# Patient Record
Sex: Female | Born: 1945
Health system: Southern US, Community
[De-identification: ages and names within clinical notes are randomized; demographics above are authoritative.]

## PROBLEM LIST (undated history)

## (undated) DIAGNOSIS — C541 Malignant neoplasm of endometrium: Secondary | ICD-10-CM

## (undated) DIAGNOSIS — I10 Essential (primary) hypertension: Secondary | ICD-10-CM

## (undated) DIAGNOSIS — L570 Actinic keratosis: Secondary | ICD-10-CM

## (undated) HISTORY — DX: Essential (primary) hypertension: I10

## (undated) HISTORY — DX: Actinic keratosis: L57.0

## (undated) HISTORY — DX: Malignant neoplasm of endometrium: C54.1

---

## 2003-09-06 HISTORY — PX: CATARACT EXTRACTION: SUR2

## 2011-08-06 HISTORY — PX: CERVICAL POLYPECTOMY: SHX88

## 2011-08-06 HISTORY — PX: HYSTEROSCOPY: SHX211

## 2011-08-15 HISTORY — PX: ENDOMETRIAL BIOPSY: SHX622

## 2011-09-06 DIAGNOSIS — C541 Malignant neoplasm of endometrium: Secondary | ICD-10-CM

## 2011-09-06 HISTORY — PX: BILATERAL SALPINGOOPHORECTOMY: SHX1223

## 2011-09-06 HISTORY — DX: Malignant neoplasm of endometrium: C54.1

## 2011-09-06 HISTORY — PX: ABDOMINAL HYSTERECTOMY: SHX81

## 2011-10-12 ENCOUNTER — Ambulatory Visit: Payer: Self-pay | Admitting: Obstetrics and Gynecology

## 2011-10-12 LAB — BASIC METABOLIC PANEL
Anion Gap: 10 (ref 7–16)
Co2: 24 mmol/L (ref 21–32)
Creatinine: 0.78 mg/dL (ref 0.60–1.30)
EGFR (African American): >60
Sodium: 140 mmol/L (ref 136–145)

## 2011-10-12 LAB — HEMOGLOBIN: HGB: 13.6 g/dL (ref 12.0–16.0)

## 2011-10-21 ENCOUNTER — Ambulatory Visit: Payer: Self-pay | Admitting: Obstetrics and Gynecology

## 2011-10-26 LAB — PATHOLOGY REPORT

## 2012-03-29 DIAGNOSIS — C549 Malignant neoplasm of corpus uteri, unspecified: Secondary | ICD-10-CM | POA: Insufficient documentation

## 2012-06-04 DIAGNOSIS — H1045 Other chronic allergic conjunctivitis: Secondary | ICD-10-CM | POA: Diagnosis not present

## 2012-08-08 DIAGNOSIS — H16209 Unspecified keratoconjunctivitis, unspecified eye: Secondary | ICD-10-CM | POA: Diagnosis not present

## 2012-08-14 DIAGNOSIS — H15009 Unspecified scleritis, unspecified eye: Secondary | ICD-10-CM | POA: Diagnosis not present

## 2012-08-23 DIAGNOSIS — H15009 Unspecified scleritis, unspecified eye: Secondary | ICD-10-CM | POA: Diagnosis not present

## 2012-09-06 DIAGNOSIS — H15009 Unspecified scleritis, unspecified eye: Secondary | ICD-10-CM | POA: Diagnosis not present

## 2012-09-21 DIAGNOSIS — B351 Tinea unguium: Secondary | ICD-10-CM | POA: Diagnosis not present

## 2012-09-21 DIAGNOSIS — L84 Corns and callosities: Secondary | ICD-10-CM | POA: Diagnosis not present

## 2012-09-27 DIAGNOSIS — H15009 Unspecified scleritis, unspecified eye: Secondary | ICD-10-CM | POA: Diagnosis not present

## 2012-10-04 DIAGNOSIS — C549 Malignant neoplasm of corpus uteri, unspecified: Secondary | ICD-10-CM | POA: Diagnosis not present

## 2013-03-01 DIAGNOSIS — R030 Elevated blood-pressure reading, without diagnosis of hypertension: Secondary | ICD-10-CM | POA: Diagnosis not present

## 2013-03-01 DIAGNOSIS — C549 Malignant neoplasm of corpus uteri, unspecified: Secondary | ICD-10-CM | POA: Diagnosis not present

## 2013-03-01 DIAGNOSIS — Z23 Encounter for immunization: Secondary | ICD-10-CM | POA: Diagnosis not present

## 2013-03-13 ENCOUNTER — Ambulatory Visit: Payer: Self-pay | Admitting: Family Medicine

## 2013-03-13 DIAGNOSIS — N6459 Other signs and symptoms in breast: Secondary | ICD-10-CM | POA: Diagnosis not present

## 2013-03-13 DIAGNOSIS — Z1231 Encounter for screening mammogram for malignant neoplasm of breast: Secondary | ICD-10-CM | POA: Diagnosis not present

## 2013-03-18 ENCOUNTER — Ambulatory Visit: Payer: Self-pay | Admitting: Family Medicine

## 2013-03-18 DIAGNOSIS — R928 Other abnormal and inconclusive findings on diagnostic imaging of breast: Secondary | ICD-10-CM | POA: Diagnosis not present

## 2013-03-18 DIAGNOSIS — N6459 Other signs and symptoms in breast: Secondary | ICD-10-CM | POA: Diagnosis not present

## 2013-04-15 DIAGNOSIS — Z0000 Encounter for general adult medical examination without abnormal findings: Secondary | ICD-10-CM | POA: Diagnosis not present

## 2013-04-15 DIAGNOSIS — I10 Essential (primary) hypertension: Secondary | ICD-10-CM | POA: Diagnosis not present

## 2013-04-15 DIAGNOSIS — Z23 Encounter for immunization: Secondary | ICD-10-CM | POA: Diagnosis not present

## 2013-04-15 DIAGNOSIS — R9430 Abnormal result of cardiovascular function study, unspecified: Secondary | ICD-10-CM | POA: Diagnosis not present

## 2013-04-16 DIAGNOSIS — Z1159 Encounter for screening for other viral diseases: Secondary | ICD-10-CM | POA: Diagnosis not present

## 2013-04-16 DIAGNOSIS — R030 Elevated blood-pressure reading, without diagnosis of hypertension: Secondary | ICD-10-CM | POA: Diagnosis not present

## 2013-04-16 DIAGNOSIS — Z13220 Encounter for screening for lipoid disorders: Secondary | ICD-10-CM | POA: Diagnosis not present

## 2013-04-22 DIAGNOSIS — R6889 Other general symptoms and signs: Secondary | ICD-10-CM | POA: Diagnosis not present

## 2013-05-03 ENCOUNTER — Ambulatory Visit: Payer: Medicare Other | Admitting: Adult Health

## 2013-05-03 ENCOUNTER — Ambulatory Visit (INDEPENDENT_AMBULATORY_CARE_PROVIDER_SITE_OTHER): Payer: Medicare Other | Admitting: Cardiovascular Disease

## 2013-05-03 ENCOUNTER — Encounter: Payer: Self-pay | Admitting: Cardiovascular Disease

## 2013-05-03 ENCOUNTER — Encounter: Payer: Self-pay | Admitting: Adult Health

## 2013-05-03 VITALS — BP 180/108 | HR 87 | Ht 63.0 in | Wt 213.0 lb

## 2013-05-03 DIAGNOSIS — R9431 Abnormal electrocardiogram [ECG] [EKG]: Secondary | ICD-10-CM | POA: Diagnosis not present

## 2013-05-03 DIAGNOSIS — I10 Essential (primary) hypertension: Secondary | ICD-10-CM

## 2013-05-03 NOTE — Assessment & Plan Note (Signed)
EKG is suggestive of  left ventricular hypertrophy. Symptoms include mild dyspnea but no chest discomfort. I recommend an echocardiogram for evaluation. She can followup as needed.

## 2013-05-03 NOTE — Progress Notes (Signed)
HPI  This is a 67 year old female who was referred by Dr. Elease Hashimoto for evaluation of hypertension and an abnormal EKG. The patient does not have any previous cardiac history. She has known history of hypertension for a few years but has not been on any medication until recently. There is no family history of premature coronary artery disease. She is a lifelong nonsmoker. She denies any chest pain or significant dyspnea. She walks 2-4 miles per day for exercise. She suffers from significant stress and anxiety. Her blood pressure is very active doing her anxiety status. She reports having high blood pressure readings at the physician's office but more controlled readings at home. She was recently started on hydrochlorothiazide 12.5 mg once daily. Recent labs were reviewed and showed a total cholesterol of 181. CBC was unremarkable. Basic metabolic profile was within normal limits. Thyroid function was normal. EKG showed sinus rhythm with left ventricular hypertrophy.  Allergies  Allergen Reactions  . Voltaren [Diclofenac Sodium] Swelling    Eye drops     Current Outpatient Prescriptions on File Prior to Visit  Medication Sig Dispense Refill  . Biotin 1 MG CAPS Take by mouth.      . hydrochlorothiazide (MICROZIDE) 12.5 MG capsule Take 12.5 mg by mouth daily.      . Omega-3 Fatty Acids (FISH OIL) 1200 MG CAPS Take 1 capsule by mouth daily.       No current facility-administered medications on file prior to visit.     Past Medical History  Diagnosis Date  . Hypertension   . Endometrial cancer 2013     Past Surgical History  Procedure Laterality Date  . Abdominal hysterectomy  2013    BSO  . Bilateral salpingoophorectomy  2013  . Cataract extraction  2005     Family History  Problem Relation Age of Onset  . Arthritis Father   . Diabetes Father     "borderline"  . Hypertension Father   . Hypertension Sister   . Diabetes Sister   . Cancer Brother     Skin CA  . Cancer  Daughter     skin CA     History   Social History  . Marital Status: Married    Spouse Name: N/A    Number of Children: N/A  . Years of Education: N/A   Occupational History  . Not on file.   Social History Main Topics  . Smoking status: Never Smoker   . Smokeless tobacco: Never Used  . Alcohol Use: Yes     Comment: occasional glass of wine  . Drug Use: No  . Sexual Activity: Yes    Birth Control/ Protection: Surgical   Other Topics Concern  . Not on file   Social History Narrative  . No narrative on file     ROS A 10 point  review of system was performed to is negative other than what is mentioned in the history of present illness.  PHYSICAL EXAM   BP 180/108  Pulse 87  Ht 5\' 3"  (1.6 m)  Wt 213 lb (96.616 kg)  BMI 37.74 kg/m2 Constitutional: She is oriented to person, place, and time. She appears well-developed and well-nourished. No distress.  HENT: No nasal discharge.  Head: Normocephalic and atraumatic.  Eyes: Pupils are equal and round. Right eye exhibits no discharge. Left eye exhibits no discharge.  Neck: Normal range of motion. Neck supple. No JVD present. No thyromegaly present.  Cardiovascular: Normal rate, regular rhythm, normal heart sounds.  Exam reveals no gallop and no friction rub. No murmur heard.  Pulmonary/Chest: Effort normal and breath sounds normal. No stridor. No respiratory distress. She has no wheezes. She has no rales. She exhibits no tenderness.  Abdominal: Soft. Bowel sounds are normal. She exhibits no distension. There is no tenderness. There is no rebound and no guarding.  Musculoskeletal: Normal range of motion. She exhibits no edema and no tenderness.  Neurological: She is alert and oriented to person, place, and time. Coordination normal.  Skin: Skin is warm and dry. No rash noted. She is not diaphoretic. No erythema. No pallor.  Psychiatric: She has a normal mood and affect. Her behavior is normal. Judgment and thought content  normal.    EKG: Sinus  Rhythm  Borderline left ventricular hypertrophy   ASSESSMENT AND PLAN

## 2013-05-03 NOTE — Progress Notes (Signed)
This encounter was created in error - please disregard.

## 2013-05-03 NOTE — Assessment & Plan Note (Signed)
It appears that the patient suffers from white coat syndrome with significant reactive hypertension. Underlying anxiety seems to be significantly contributing to this. Repeat blood pressure by me was 168/98. Most of her home blood pressure readings less than 140 systolic. Continue treatment with hydrochlorothiazide. Consider adding a small dose beta blocker if her blood pressure continues to run high. 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

## 2013-05-03 NOTE — Patient Instructions (Addendum)
Your physician has requested that you have an echocardiogram. Echocardiography is a painless test that uses sound waves to create images of your heart. It provides your doctor with information about the size and shape of your heart and how well your heart's chambers and valves are working. This procedure takes approximately one hour. There are no restrictions for this procedure.  Follow up as needed 

## 2013-05-16 DIAGNOSIS — R7309 Other abnormal glucose: Secondary | ICD-10-CM | POA: Diagnosis not present

## 2013-05-16 DIAGNOSIS — Z0000 Encounter for general adult medical examination without abnormal findings: Secondary | ICD-10-CM | POA: Diagnosis not present

## 2013-05-16 DIAGNOSIS — I10 Essential (primary) hypertension: Secondary | ICD-10-CM | POA: Diagnosis not present

## 2013-05-16 DIAGNOSIS — Z23 Encounter for immunization: Secondary | ICD-10-CM | POA: Diagnosis not present

## 2013-05-22 ENCOUNTER — Other Ambulatory Visit: Payer: Self-pay

## 2013-05-22 ENCOUNTER — Other Ambulatory Visit (INDEPENDENT_AMBULATORY_CARE_PROVIDER_SITE_OTHER): Payer: Medicare Other

## 2013-05-22 DIAGNOSIS — I059 Rheumatic mitral valve disease, unspecified: Secondary | ICD-10-CM

## 2013-05-22 DIAGNOSIS — R9431 Abnormal electrocardiogram [ECG] [EKG]: Secondary | ICD-10-CM | POA: Diagnosis not present

## 2013-05-22 DIAGNOSIS — I10 Essential (primary) hypertension: Secondary | ICD-10-CM

## 2013-05-23 ENCOUNTER — Telehealth: Payer: Self-pay

## 2013-05-23 NOTE — Telephone Encounter (Signed)
Message copied by Marilynne Halsted on Thu May 23, 2013  9:04 AM ------      Message from: Lorine Bears A      Created: Thu May 23, 2013  8:19 AM       Inform patient that echo was normal except mild thickening of the heart muscle which is due to hight blood pressure.       Send a copy to Dr. Elease Hashimoto ------

## 2013-05-23 NOTE — Telephone Encounter (Signed)
Spoke w/ pt.  She is aware of results.  

## 2013-06-26 DIAGNOSIS — Z0000 Encounter for general adult medical examination without abnormal findings: Secondary | ICD-10-CM | POA: Diagnosis not present

## 2013-06-26 DIAGNOSIS — Z23 Encounter for immunization: Secondary | ICD-10-CM | POA: Diagnosis not present

## 2013-06-26 DIAGNOSIS — I10 Essential (primary) hypertension: Secondary | ICD-10-CM | POA: Diagnosis not present

## 2013-06-26 DIAGNOSIS — R7309 Other abnormal glucose: Secondary | ICD-10-CM | POA: Diagnosis not present

## 2013-11-29 ENCOUNTER — Ambulatory Visit: Payer: Self-pay | Admitting: Family Medicine

## 2013-11-29 DIAGNOSIS — R922 Inconclusive mammogram: Secondary | ICD-10-CM | POA: Diagnosis not present

## 2013-11-29 DIAGNOSIS — N6489 Other specified disorders of breast: Secondary | ICD-10-CM | POA: Diagnosis not present

## 2013-12-12 IMAGING — US ULTRASOUND RIGHT BREAST
1 series · 13 of 22 positions shown · non-contrast
Comparison: 03/13/2013.

REASON FOR EXAM: av rt asymmetry
COMMENTS:

PROCEDURE:     US  - US BREAST RIGHT  - March 18, 2013 [DATE]
RESULT:

[Series 1: ultrasound right breast · 0.10mm/px · 13 of 22 slices shown]
[im 1/22]
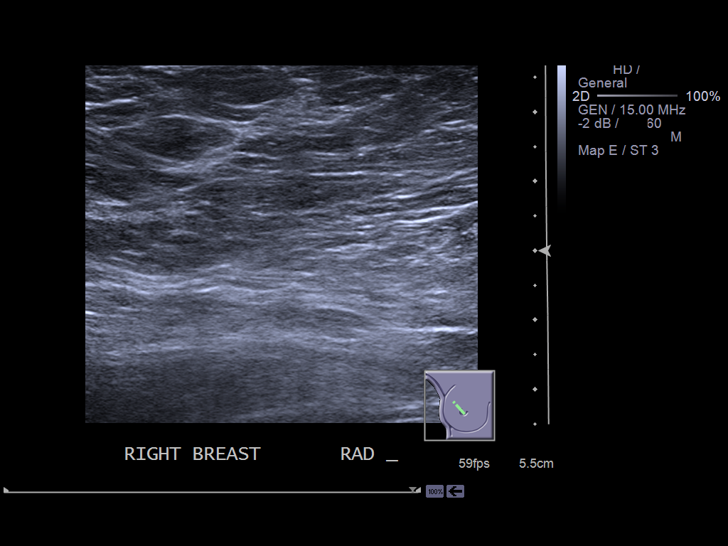
[im 3/22]
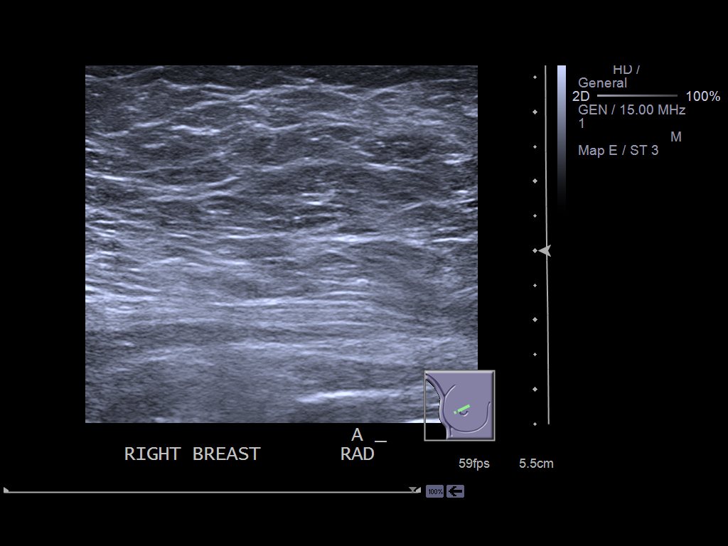
[im 5/22]
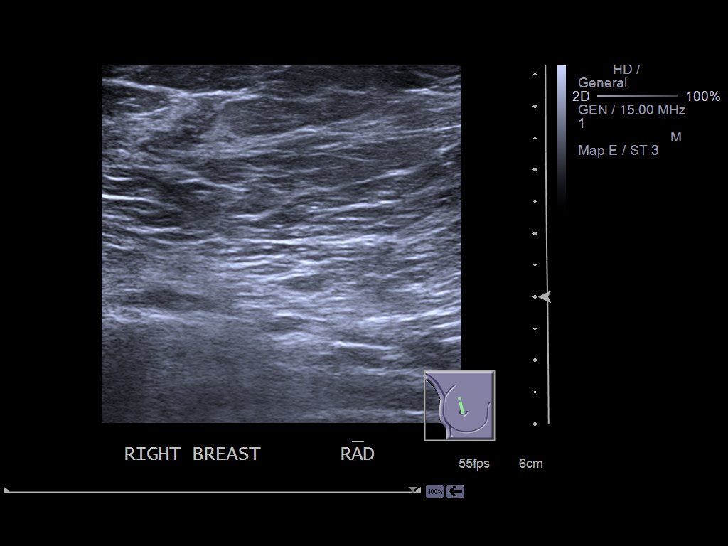
[im 6/22]
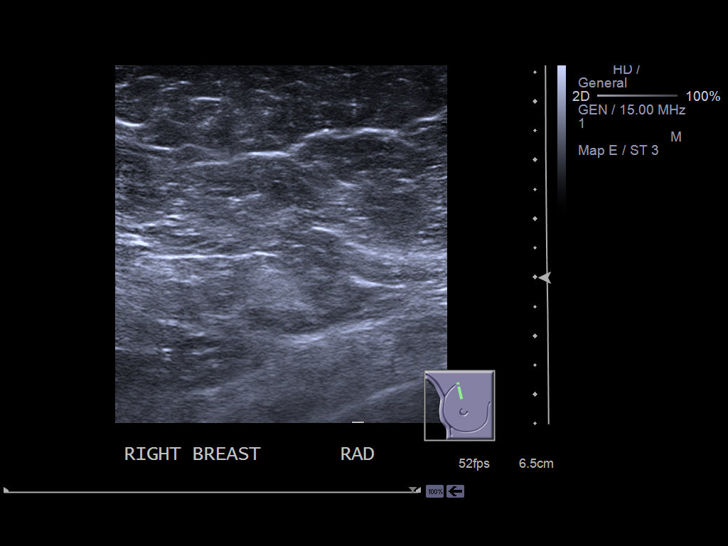
[im 8/22]
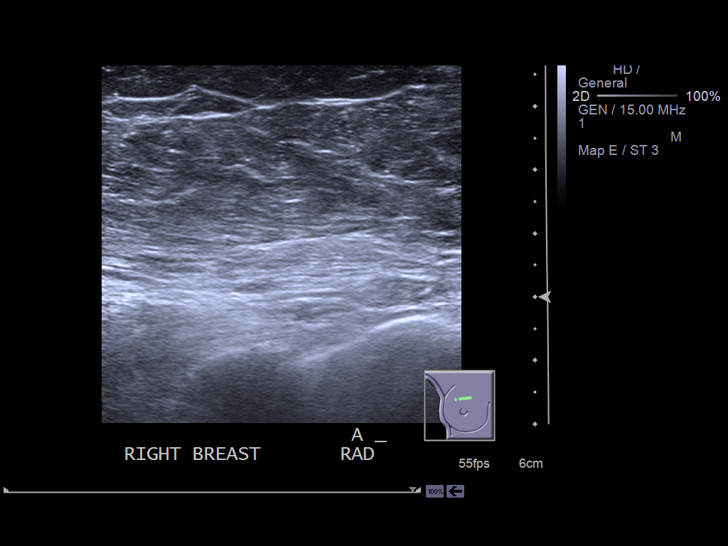
[im 10/22]
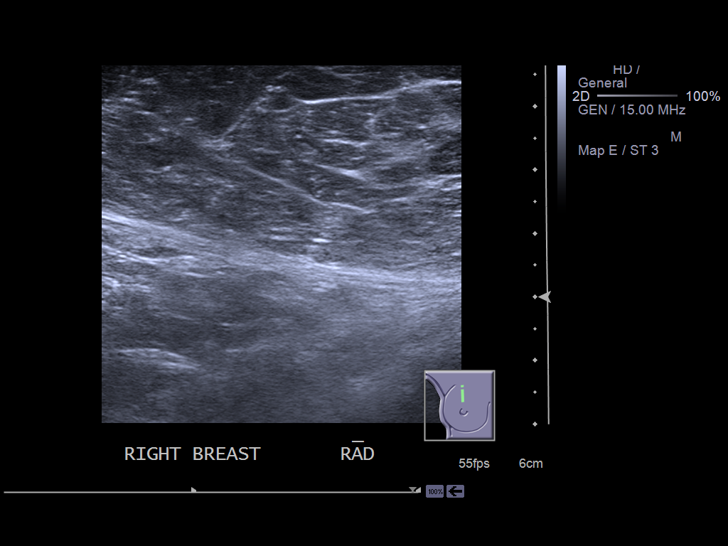
[im 12/22]
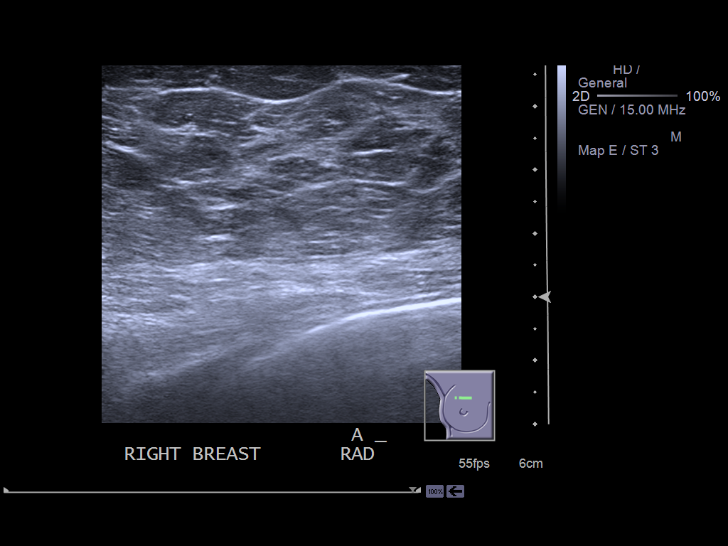
[im 13/22]
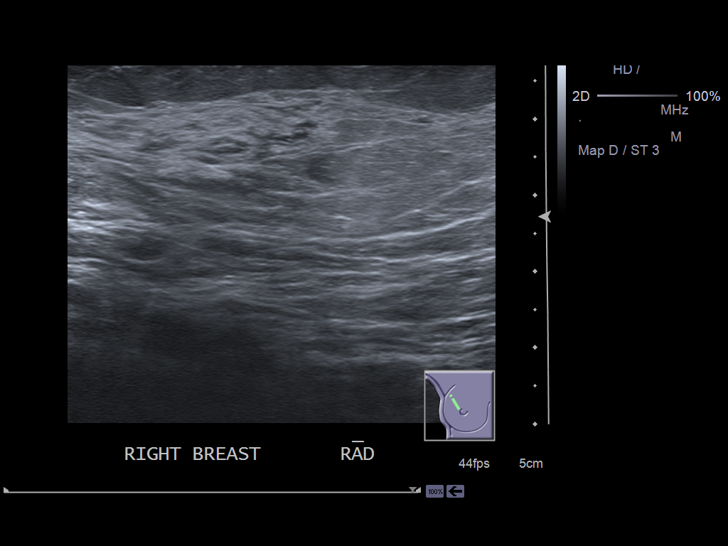
[im 15/22]
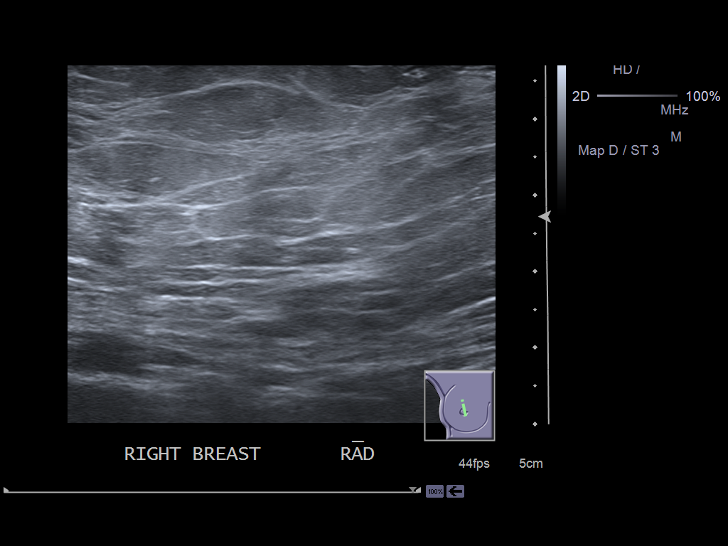
[im 17/22]
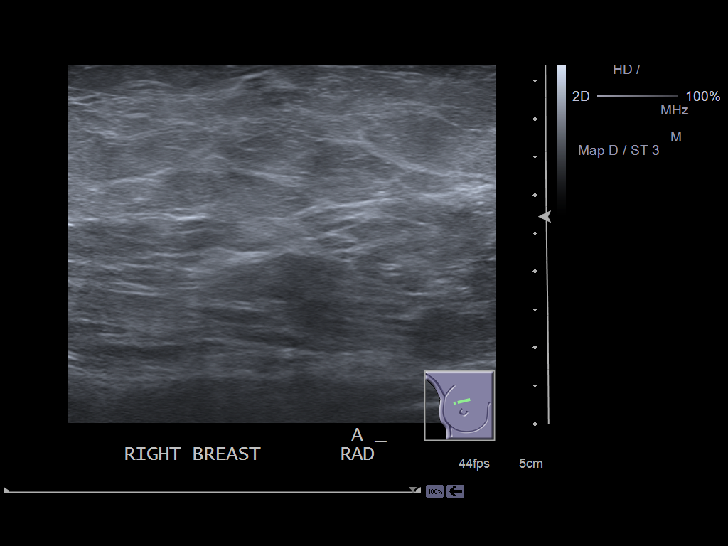
[im 18/22]
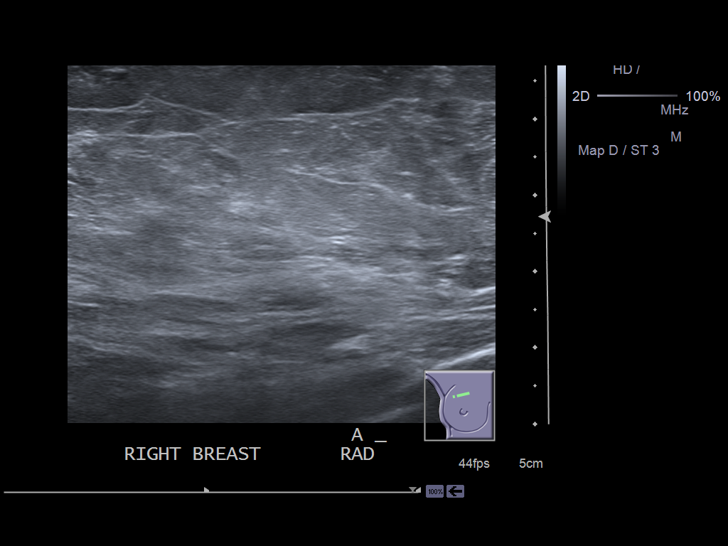
[im 20/22]
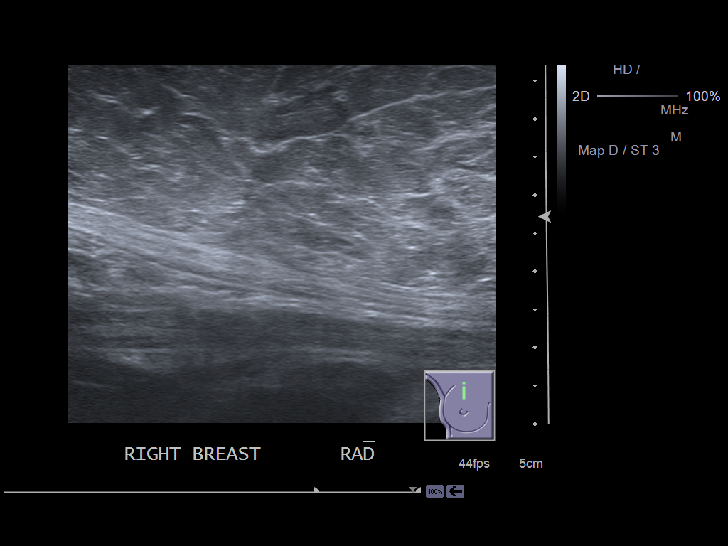
[im 22/22]
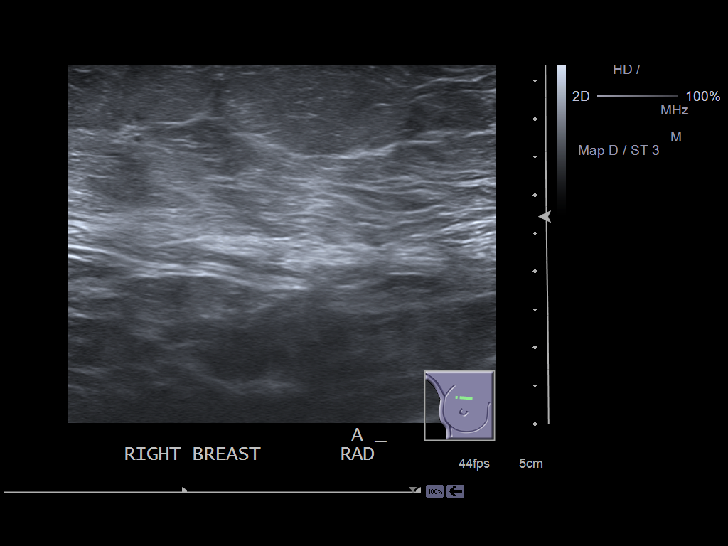

[13 of 22 positions shown; findings below may reference images not displayed]

FINDINGS: True lateral and spot compression magnification views were performed to
evaluate the global asymmetry in the superolateral right breast. With the
spot compression magnification view, this region demonstrates the appearance
of normal fibroglandular tissue. No mass or architectural distortion
identified. No suspicious calcifications are seen.

Real time ultrasound was performed of the superolateral right breast from 10
o'clock through 12 o'clock. No mass or suspicious shadowing was identified.
IMPRESSION: BI-RADS: Category 3 - Probably Benign Finding (interval follow-up)

The global asymmetry in the superolateral right breast is most likely
related to asymmetric breast tissue compared to the left breast, but is
without prior mammograms for comparison of stability. Followup mammograms of
the right breast are recommended in 6 months.  However, if desired by the
patient and/or patient's physician, stereotactic-guided biopsy could be
performed of this region in the interval. If there is palpable abnormality
in the superolateral right breast, biopsy would be recommended at this time.

This was discussed immediately after the ultrasound examination.

## 2013-12-23 DIAGNOSIS — R7309 Other abnormal glucose: Secondary | ICD-10-CM | POA: Diagnosis not present

## 2013-12-23 DIAGNOSIS — C549 Malignant neoplasm of corpus uteri, unspecified: Secondary | ICD-10-CM | POA: Diagnosis not present

## 2013-12-23 DIAGNOSIS — Z23 Encounter for immunization: Secondary | ICD-10-CM | POA: Diagnosis not present

## 2013-12-23 DIAGNOSIS — I10 Essential (primary) hypertension: Secondary | ICD-10-CM | POA: Diagnosis not present

## 2014-06-05 ENCOUNTER — Ambulatory Visit: Payer: Self-pay | Admitting: Family Medicine

## 2014-06-05 DIAGNOSIS — N6489 Other specified disorders of breast: Secondary | ICD-10-CM | POA: Diagnosis not present

## 2014-06-05 DIAGNOSIS — R928 Other abnormal and inconclusive findings on diagnostic imaging of breast: Secondary | ICD-10-CM | POA: Diagnosis not present

## 2014-06-25 DIAGNOSIS — Z23 Encounter for immunization: Secondary | ICD-10-CM | POA: Diagnosis not present

## 2014-06-25 DIAGNOSIS — C541 Malignant neoplasm of endometrium: Secondary | ICD-10-CM | POA: Diagnosis not present

## 2014-06-25 DIAGNOSIS — Z1389 Encounter for screening for other disorder: Secondary | ICD-10-CM | POA: Diagnosis not present

## 2014-06-25 DIAGNOSIS — R7309 Other abnormal glucose: Secondary | ICD-10-CM | POA: Diagnosis not present

## 2014-06-25 DIAGNOSIS — R030 Elevated blood-pressure reading, without diagnosis of hypertension: Secondary | ICD-10-CM | POA: Diagnosis not present

## 2014-06-25 DIAGNOSIS — Z0000 Encounter for general adult medical examination without abnormal findings: Secondary | ICD-10-CM | POA: Diagnosis not present

## 2014-06-25 DIAGNOSIS — I10 Essential (primary) hypertension: Secondary | ICD-10-CM | POA: Diagnosis not present

## 2014-06-25 DIAGNOSIS — R739 Hyperglycemia, unspecified: Secondary | ICD-10-CM | POA: Diagnosis not present

## 2014-06-25 LAB — CBC AND DIFFERENTIAL
HCT: 41 % (ref 36–46)
Hemoglobin: 14 g/dL (ref 12.0–16.0)
Platelets: 303 10*3/uL (ref 150–399)
WBC: 7.9 10*3/mL

## 2014-06-25 LAB — HEPATIC FUNCTION PANEL
ALT: 11 U/L (ref 7–35)
AST: 18 U/L (ref 13–35)

## 2014-06-25 LAB — TSH: TSH: 1.05 u[IU]/mL (ref <=5.90)

## 2014-06-25 LAB — BASIC METABOLIC PANEL
BUN: 16 mg/dL (ref 4–21)
CREATININE: 0.8 mg/dL (ref <=1.1)
Glucose: 101 mg/dL
Potassium: 4.4 mmol/L (ref 3.4–5.3)
Sodium: 139 mmol/L (ref 137–147)

## 2014-12-24 DIAGNOSIS — I10 Essential (primary) hypertension: Secondary | ICD-10-CM | POA: Diagnosis not present

## 2014-12-24 DIAGNOSIS — R739 Hyperglycemia, unspecified: Secondary | ICD-10-CM | POA: Diagnosis not present

## 2014-12-24 LAB — HEMOGLOBIN A1C: Hgb A1c MFr Bld: 5.7 % (ref 4.0–6.0)

## 2014-12-25 DIAGNOSIS — Z8542 Personal history of malignant neoplasm of other parts of uterus: Secondary | ICD-10-CM | POA: Insufficient documentation

## 2014-12-25 DIAGNOSIS — IMO0002 Reserved for concepts with insufficient information to code with codable children: Secondary | ICD-10-CM | POA: Insufficient documentation

## 2014-12-25 DIAGNOSIS — M20009 Unspecified deformity of unspecified finger(s): Secondary | ICD-10-CM | POA: Insufficient documentation

## 2014-12-25 DIAGNOSIS — N950 Postmenopausal bleeding: Secondary | ICD-10-CM | POA: Insufficient documentation

## 2014-12-25 DIAGNOSIS — L821 Other seborrheic keratosis: Secondary | ICD-10-CM | POA: Insufficient documentation

## 2014-12-25 DIAGNOSIS — I8390 Asymptomatic varicose veins of unspecified lower extremity: Secondary | ICD-10-CM | POA: Insufficient documentation

## 2014-12-25 DIAGNOSIS — R7303 Prediabetes: Secondary | ICD-10-CM | POA: Insufficient documentation

## 2014-12-25 DIAGNOSIS — R739 Hyperglycemia, unspecified: Secondary | ICD-10-CM | POA: Insufficient documentation

## 2014-12-25 DIAGNOSIS — C541 Malignant neoplasm of endometrium: Secondary | ICD-10-CM | POA: Insufficient documentation

## 2014-12-25 HISTORY — DX: Morbid (severe) obesity due to excess calories: E66.01

## 2014-12-28 NOTE — Op Note (Signed)
PATIENT NAME:  Kathleen Glenn, Kathleen Glenn MR#:  [id number] DATE OF BIRTH:  01/03/1946  DATE OF PROCEDURE:  10/21/2011  PREOPERATIVE DIAGNOSES:  1. Postmenopausal bleeding.  2. Endometrial fibroid.   POSTOPERATIVE DIAGNOSES:  1. Postmenopausal bleeding.  2. Endometrial polyp.   PROCEDURES:  1. Fractional dilation and curettage. 2. Resectoscopic removal of endometrial polyp.   SURGEON: Laverta Baltimore, MD  FIRST ASSISTANT: None.   ANESTHESIA: General endotracheal.   INDICATION: This is a 69 year old gravida 2, para 2, a patient with postmenopausal bleeding. Saline infusion sonohysterography shows a 2.2 x 1.7 cm calcified mass, in the endometrial cavity, and endometrial biopsy negative.   DESCRIPTION OF PROCEDURE: After adequate general endotracheal anesthesia, the patient was placed in the dorsal supine position with the legs in the candy cane stirrups. Lower abdomen, perineal, and vaginal prep was performed. The patient was sterilely draped. She did receive 1 gram of IV Ancef prior to commencement of the case. Straight catheterization of the bladder yielded 150 mL of clear urine. A weighted speculum was placed in the posterior vaginal vault, and the anterior cervix was grasped with a single-tooth tenaculum. Endocervical curettage was performed and then uterine sounding with sound to 7 cm. The cervix was then dilated with a #19 Hanks dilator. The resectoscope was brought up to the operative field. 1.5% glycine was used as distending medium, and resectoscope with hysteroscopic evaluation advanced into the endometrial cavity. Of note, there was a polypoid sessile mass of the right posterior aspect. The resectoscope was used to remove this mass. Fluffy endometrium was noted around this and anteriorly and several additional shavings were performed with additional fragments of endometrial tissue. Good hemostasis was noted. The resectoscope was then removed and retrieval of the additional curettings removed.  Sharp curettage was performed with good uterine cry with a small amount of additional tissue. Repeat hysteroscope revealed no active bleeding at the end of the case. 1400 mL of glycine and 1.5% glycine used with a deficit of 70 mL. Good hemostasis was noted at the end of the case. The patient tolerated the procedure well and was taken to the recovery room in good condition. Minimal bleeding. Intraoperative fluids 400 mL.  ____________________________ Boykin Nearing, MD tjs:slb D: 10/21/2011 11:06:28 ET T: 10/21/2011 11:44:38 ET JOB#: [id number]  cc: Boykin Nearing, MD, <Dictator> Boykin Nearing MD ELECTRONICALLY SIGNED 10/24/2011 9:24

## 2015-01-07 DIAGNOSIS — Z961 Presence of intraocular lens: Secondary | ICD-10-CM | POA: Diagnosis not present

## 2015-02-04 ENCOUNTER — Ambulatory Visit (INDEPENDENT_AMBULATORY_CARE_PROVIDER_SITE_OTHER): Payer: Medicare Other | Admitting: Family Medicine

## 2015-02-04 ENCOUNTER — Encounter: Payer: Self-pay | Admitting: Family Medicine

## 2015-02-04 VITALS — BP 158/84 | HR 88 | Temp 98.1°F | Resp 16 | Ht 63.5 in | Wt 218.0 lb

## 2015-02-04 DIAGNOSIS — I10 Essential (primary) hypertension: Secondary | ICD-10-CM | POA: Diagnosis not present

## 2015-02-04 NOTE — Progress Notes (Signed)
Subjective:    Patient ID: Kathleen Glenn, female    DOB: 02-06-1946, 69 y.o.   MRN: [id number]  Hypertension This is a chronic (Pt was seen here 12/24/2014 and her blood pressure medication was changed from HCTZ to Triam/HCTZ.  ) problem. The problem has been gradually improving since onset. The problem is controlled (Pt says "My blood pressure always goes up when I'm at the [place]."). Pertinent negatives include no chest pain, headaches, malaise/fatigue, neck pain, palpitations, peripheral edema, shortness of breath or sweats. The current treatment provides moderate (Pt brought in her blood pressure reading from home.  Her bloods are generally 120-130's/ 70-80's) improvement. There are no compliance problems.       Review of Systems  Constitutional: Negative for fever, chills, malaise/fatigue, diaphoresis, activity change, appetite change, fatigue and unexpected weight change.  Respiratory: Negative for cough, chest tightness, shortness of breath and wheezing.   Cardiovascular: Negative for chest pain, palpitations and leg swelling.  Gastrointestinal: Negative for nausea, vomiting, abdominal pain, diarrhea, constipation and blood in stool.  Musculoskeletal: Negative for myalgias, back pain, joint swelling, arthralgias, gait problem, neck pain and neck stiffness.  Neurological: Negative for dizziness, light-headedness and headaches.   Patient Active Problem List   Diagnosis Date Noted  . Acquired deformity of finger 12/25/2014  . Primary cancer of endometrium 12/25/2014  . Blood glucose elevated 12/25/2014  . Adult BMI 30+ 12/25/2014  . Hemorrhage, postmenopausal 12/25/2014  . Basal cell papilloma 12/25/2014  . Leg varices 12/25/2014  . Essential hypertension 05/03/2013  . Abnormal EKG 05/03/2013  . Cancer of corpus uteri, except isthmus 03/29/2012   History   Social History  . Marital Status: Married    Spouse Name: N/A  . Number of Children: N/A  . Years of Education: N/A    Occupational History  . Not on file.   Social History Main Topics  . Smoking status: Never Smoker   . Smokeless tobacco: Never Used  . Alcohol Use: Yes     Comment: occasional glass of wine  . Drug Use: No  . Sexual Activity: Yes    Birth Control/ Protection: Surgical   Other Topics Concern  . Not on file   Social History Narrative   Current Outpatient Prescriptions on File Prior to Visit  Medication Sig Dispense Refill  . Biotin 1 MG CAPS Take by mouth.    . Multiple Vitamins-Minerals (MULTIVITAMIN PO) Take by mouth.    . Omega-3 Fatty Acids (FISH OIL) 1200 MG CAPS Take 1 capsule by mouth daily.    Marland Kitchen triamterene-hydrochlorothiazide (DYAZIDE) 37.5-25 MG per capsule Take by mouth.     No current facility-administered medications on file prior to visit.   Allergies  Allergen Reactions  . Voltaren [Diclofenac Sodium] Swelling    Eye drops   Past Surgical History  Procedure Laterality Date  . Bilateral salpingoophorectomy  2013  . Cataract extraction  2005  . Abdominal hysterectomy  2013    BSO, Tumor Stage 1 Cancer  . Endometrial biopsy  08/15/2011    Dr. Avelina Laine;   . Cervical polypectomy  08/2011    Dr. Avelina Laine  . Hysteroscopy  08/2011    Dr. Avelina Laine    Blood pressure 158/84, pulse 88, temperature 98.1 F (36.7 C), temperature source Oral, resp. rate 16, height 5' 3.5" (1.613 m), weight 218 lb (98.884 kg).    Objective:   Physical Exam  Constitutional: She is oriented to person, place, and time. She appears well-developed and well-nourished.  Cardiovascular:  Normal rate, regular rhythm, normal heart sounds and intact distal pulses.   Neurological: She is alert and oriented to person, place, and time.  Psychiatric: She has a normal mood and affect. Her speech is normal and behavior is normal. Judgment normal. Cognition and memory are normal.          Assessment & Plan:  1. Essential hypertension Stable, tolerating new medication well.   Will recheck at [facility] Exam in October.   Patient was seen and examined by Jerrell Belfast, MD, and note scribed by Ashley Royalty, CMA.  I have reviewed the document for accuracy and completeness and I agree with above. Jerrell Belfast, MD

## 2015-06-15 ENCOUNTER — Other Ambulatory Visit: Payer: Self-pay | Admitting: Family Medicine

## 2015-06-15 DIAGNOSIS — I10 Essential (primary) hypertension: Secondary | ICD-10-CM

## 2015-06-23 ENCOUNTER — Ambulatory Visit (INDEPENDENT_AMBULATORY_CARE_PROVIDER_SITE_OTHER): Payer: Medicare Other | Admitting: Family Medicine

## 2015-06-23 ENCOUNTER — Encounter: Payer: Self-pay | Admitting: Family Medicine

## 2015-06-23 VITALS — BP 142/88 | HR 96 | Temp 98.5°F | Resp 16 | Wt 221.0 lb

## 2015-06-23 DIAGNOSIS — R1030 Lower abdominal pain, unspecified: Secondary | ICD-10-CM

## 2015-06-23 DIAGNOSIS — N3090 Cystitis, unspecified without hematuria: Secondary | ICD-10-CM | POA: Diagnosis not present

## 2015-06-23 LAB — POCT URINALYSIS DIPSTICK
Bilirubin, UA: NEGATIVE
Glucose, UA: NEGATIVE
Ketones, UA: NEGATIVE
Leukocytes, UA: NEGATIVE
NITRITE UA: NEGATIVE
SPEC GRAV UA: 1.01
UROBILINOGEN UA: 0.2
pH, UA: 7.5

## 2015-06-23 MED ORDER — PHENAZOPYRIDINE HCL 100 MG PO TABS: 100.0000 mg | ORAL_TABLET | Freq: Three times a day (TID) | ORAL | Status: DC | PRN

## 2015-06-23 NOTE — Progress Notes (Signed)
Patient ID: APRYLL Glenn, female   DOB: March 18, 1946, 69 y.o.   MRN: [id number]        Patient: Kathleen Glenn Female    DOB: 1945-10-20   69 y.o.   MRN: [id number] Visit Date: 06/23/2015  Today's Provider: Margarita Rana, MD   Chief Complaint  Patient presents with  . Urinary Tract Infection   Subjective:    Urinary Tract Infection  This is a new problem. The current episode started in the past 7 days. The problem has been gradually worsening. The quality of the pain is described as burning. The pain is at a severity of 10/10 (Has urinated today.  Not burning. ). The pain is moderate. There has been no fever. She is sexually active (But not recently. ). There is no history of pyelonephritis. Associated symptoms include frequency and urgency. Pertinent negatives include no chills, discharge, flank pain, hematuria, hesitancy, nausea or vomiting. She has tried nothing for the symptoms. There is no history of recurrent UTIs. Did have consitpation before this started.  No vaginal discharge.        Allergies  Allergen Reactions  . Voltaren [Diclofenac Sodium] Swelling    Eye drops   Previous Medications   BIOTIN 1 MG CAPS    Take by mouth.   MULTIPLE VITAMINS-MINERALS (MULTIVITAMIN PO)    Take by mouth.   OMEGA-3 FATTY ACIDS (FISH OIL) 1200 MG CAPS    Take 1 capsule by mouth daily.   TRIAMTERENE-HYDROCHLOROTHIAZIDE (DYAZIDE) 37.5-25 MG CAPSULE    1 (ONE) CAPSULE, ORAL, DAILY    Review of Systems  Constitutional: Positive for fatigue. Negative for fever, chills, activity change, appetite change and unexpected weight change.  Gastrointestinal: Positive for abdominal pain (Abdominal cramping.). Negative for nausea, vomiting, diarrhea, constipation, blood in stool, abdominal distention, anal bleeding and rectal pain.  Genitourinary: Positive for dysuria, urgency, frequency, difficulty urinating and pelvic pain. Negative for hesitancy, hematuria, flank pain, decreased urine volume, vaginal  bleeding, vaginal discharge, enuresis, genital sores, vaginal pain, menstrual problem and dyspareunia.  Musculoskeletal: Negative for back pain.  Neurological: Positive for weakness. Negative for dizziness, light-headedness and headaches.    Social History  Substance Use Topics  . Smoking status: Never Smoker   . Smokeless tobacco: Never Used  . Alcohol Use: Yes     Comment: occasional glass of wine   Objective:   BP 142/88 mmHg  Pulse 96  Temp(Src) 98.5 F (36.9 C) (Oral)  Resp 16  Wt 221 lb (100.245 kg)  Physical Exam  Constitutional: She is oriented to person, place, and time. She appears well-developed and well-nourished.  Abdominal: Soft. Bowel sounds are normal. There is no tenderness.  Neurological: She is alert and oriented to person, place, and time.  Psychiatric: She has a normal mood and affect. Her behavior is normal. Judgment and thought content normal.      Assessment & Plan:      1. Cystitis Suspect inflammation related to passing a stone. Pain is better. Encouraged increased fluid. Will try pyridium for next few days and re-assess Will call if any fever or systemic symptoms. Will send for culture. - POCT urinalysis dipstick - phenazopyridine (PYRIDIUM) 100 MG tablet; Take 1 tablet (100 mg total) by mouth 3 (three) times daily as needed for pain.  Dispense: 10 tablet; Refill: 0 - Urine culture Results for orders placed or performed in visit on 06/23/15  POCT urinalysis dipstick  Result Value Ref Range   Color, UA     Clarity,  UA     Glucose, UA Negative    Bilirubin, UA Negative    Ketones, UA Negative    Spec Grav, UA 1.010    Blood, UA Trace    pH, UA 7.5    Protein, UA 30+    Urobilinogen, UA 0.2    Nitrite, UA Negative    Leukocytes, UA Negative Negative     2. Abdominal pain, lower Suspect related to passing as stone.  Call if symptoms recur.       Margarita Rana, MD  [facility]

## 2015-06-24 ENCOUNTER — Ambulatory Visit: Payer: Self-pay | Admitting: Family Medicine

## 2015-06-24 LAB — URINE CULTURE

## 2015-06-25 ENCOUNTER — Telehealth: Payer: Self-pay

## 2015-06-25 NOTE — Telephone Encounter (Signed)
-----   Message from Margarita Rana, MD sent at 06/25/2015  7:24 AM EDT ----- No infection, as suspected. Please see if patient is feeling better. Thanks.

## 2015-06-25 NOTE — Telephone Encounter (Signed)
Pt advised; she reports feeling better today.  Thanks,   -Mickel Baas

## 2015-06-26 ENCOUNTER — Telehealth: Payer: Self-pay

## 2015-06-26 DIAGNOSIS — N3090 Cystitis, unspecified without hematuria: Secondary | ICD-10-CM

## 2015-06-26 MED ORDER — PHENAZOPYRIDINE HCL 100 MG PO TABS: 100.0000 mg | ORAL_TABLET | Freq: Three times a day (TID) | ORAL | Status: DC | PRN

## 2015-06-26 NOTE — Telephone Encounter (Signed)
Pt is requesting refill on pyridium. Pt reports that she is feeling better but still has some pain/ pressure on and off. Pt would like to have some extra meds for the weekend. CB# [phone].

## 2015-07-09 ENCOUNTER — Ambulatory Visit (INDEPENDENT_AMBULATORY_CARE_PROVIDER_SITE_OTHER): Payer: Medicare Other | Admitting: Family Medicine

## 2015-07-09 ENCOUNTER — Encounter: Payer: Self-pay | Admitting: Family Medicine

## 2015-07-09 VITALS — BP 144/84 | HR 88 | Temp 98.4°F | Resp 16 | Wt 217.0 lb

## 2015-07-09 DIAGNOSIS — R195 Other fecal abnormalities: Secondary | ICD-10-CM

## 2015-07-09 DIAGNOSIS — C541 Malignant neoplasm of endometrium: Secondary | ICD-10-CM

## 2015-07-09 DIAGNOSIS — C549 Malignant neoplasm of corpus uteri, unspecified: Secondary | ICD-10-CM

## 2015-07-09 DIAGNOSIS — Q524 Other congenital malformations of vagina: Secondary | ICD-10-CM | POA: Diagnosis not present

## 2015-07-09 NOTE — Progress Notes (Signed)
Patient ID: Kathleen Glenn, female   DOB: 1946/05/25, 69 y.o.   MRN: [id number]         Patient: Kathleen Glenn Female    DOB: 1946/05/12   69 y.o.   MRN: [id number] Visit Date: 07/09/2015  Today's Provider: Margarita Rana, MD   No chief complaint on file.  Subjective:    HPI Pt comes in today concerned about "having bowel leakage through her vagina".  She said it happened yesterday after a bout of constipation.  She reports taking OTC Sennakot and she has several bowel movements which "leaked through her vagina".  She says this has since resolved.  Washed out her vagina and has not seen any more today. No pain. Does have history of cancer and has had a hysterectomy and bilateral ooph.  Has had no problems since. Did have possible stone and infection several weeks ago and this has resolved.     Allergies  Allergen Reactions  . Voltaren [Diclofenac Sodium] Swelling    Eye drops   Previous Medications   BIOTIN 1 MG CAPS    Take by mouth.   MULTIPLE VITAMINS-MINERALS (MULTIVITAMIN PO)    Take by mouth.   OMEGA-3 FATTY ACIDS (FISH OIL) 1200 MG CAPS    Take 1 capsule by mouth daily.   PHENAZOPYRIDINE (PYRIDIUM) 100 MG TABLET    Take 1 tablet (100 mg total) by mouth 3 (three) times daily as needed for pain.   TRIAMTERENE-HYDROCHLOROTHIAZIDE (DYAZIDE) 37.5-25 MG CAPSULE    1 (ONE) CAPSULE, ORAL, DAILY    Review of Systems  Constitutional: Positive for diaphoresis. Negative for chills, activity change, appetite change, fatigue (Pt was feeling very tired and not well yesterday; but she is starting to feel better now. ) and unexpected weight change.  Respiratory: Negative.   Cardiovascular: Negative.   Gastrointestinal: Negative for blood in stool, abdominal distention and anal bleeding.  Genitourinary: Positive for frequency and vaginal discharge. Negative for dysuria, urgency, hematuria, flank pain, decreased urine volume, vaginal bleeding, enuresis, genital sores, vaginal pain, menstrual  problem, pelvic pain and dyspareunia.  Neurological: Positive for tremors and weakness. Negative for dizziness, light-headedness and headaches.    Social History  Substance Use Topics  . Smoking status: Never Smoker   . Smokeless tobacco: Never Used  . Alcohol Use: Yes     Comment: occasional glass of wine   Objective:   BP 144/84 mmHg  Pulse 88  Temp(Src) 98.4 F (36.9 C) (Oral)  Resp 16  Wt 217 lb (98.431 kg)  Physical Exam  Constitutional: She is oriented to person, place, and time. She appears well-developed and well-nourished.  Genitourinary: Vaginal discharge (Greenish) found.  Vagina with some erythema at opening.   Vaginal vault with no obvious anomaly.  Cuff partially visualized. No stool noted, some green discharge. No friable tissue noted.    Neurological: She is alert and oriented to person, place, and time.  Psychiatric: She has a normal mood and affect. Her behavior is normal. Judgment and thought content normal.      Assessment & Plan:         1. Primary cancer of endometrium [facility]) Status post definitive treatment and had been released by Gyn/Onc.   3. Vaginal anomaly Patient noted stool in vagina. Not noted today. Talked with Gyn/Onc attending at [facility] who recommended Colon enema to evaluate.  - DG Colon W/Cm Ltd; Future  4. Abnormal stools Will schedule.   - DG Colon W/Cm Ltd; Future  Margarita Rana, MD  [facility]

## 2015-07-13 ENCOUNTER — Ambulatory Visit
Admission: RE | Admit: 2015-07-13 | Discharge: 2015-07-13 | Disposition: A | Discharge status: Home / Self Care | Payer: Medicare Other | Source: Ambulatory Visit | Attending: Family Medicine | Admitting: Family Medicine

## 2015-07-13 DIAGNOSIS — R195 Other fecal abnormalities: Secondary | ICD-10-CM

## 2015-07-13 DIAGNOSIS — N898 Other specified noninflammatory disorders of vagina: Secondary | ICD-10-CM | POA: Diagnosis not present

## 2015-07-13 DIAGNOSIS — K5730 Diverticulosis of large intestine without perforation or abscess without bleeding: Secondary | ICD-10-CM | POA: Diagnosis not present

## 2015-07-13 DIAGNOSIS — N823 Fistula of vagina to large intestine: Secondary | ICD-10-CM | POA: Insufficient documentation

## 2015-07-13 DIAGNOSIS — Q524 Other congenital malformations of vagina: Secondary | ICD-10-CM | POA: Diagnosis present

## 2015-07-14 ENCOUNTER — Telehealth: Payer: Self-pay | Admitting: Family Medicine

## 2015-07-14 DIAGNOSIS — N823 Fistula of vagina to large intestine: Secondary | ICD-10-CM | POA: Insufficient documentation

## 2015-07-14 NOTE — Telephone Encounter (Signed)
Sarah- Please schedule. Thanks- Izora Gala

## 2015-07-14 NOTE — Telephone Encounter (Signed)
[facility] has set up appointments. Will notify patient. Thanks.

## 2015-07-14 NOTE — Telephone Encounter (Signed)
Pt is stating Dr Theora Gianotti at [facility] has not called her yet.  Pt has decided to have surgery at [facility] but has not received a referral to have the surgery.  Pt is requesting Dr Venia Minks to contact [facility] to see what is holding this up.  CB#[phone] or [phone]

## 2015-07-20 DIAGNOSIS — E041 Nontoxic single thyroid nodule: Secondary | ICD-10-CM | POA: Diagnosis not present

## 2015-07-20 DIAGNOSIS — C541 Malignant neoplasm of endometrium: Secondary | ICD-10-CM | POA: Diagnosis not present

## 2015-07-20 DIAGNOSIS — N823 Fistula of vagina to large intestine: Secondary | ICD-10-CM | POA: Diagnosis not present

## 2015-07-20 DIAGNOSIS — C549 Malignant neoplasm of corpus uteri, unspecified: Secondary | ICD-10-CM | POA: Diagnosis not present

## 2015-07-20 DIAGNOSIS — K769 Liver disease, unspecified: Secondary | ICD-10-CM | POA: Diagnosis not present

## 2015-07-20 DIAGNOSIS — D1771 Benign lipomatous neoplasm of kidney: Secondary | ICD-10-CM | POA: Diagnosis not present

## 2015-07-28 DIAGNOSIS — Z79899 Other long term (current) drug therapy: Secondary | ICD-10-CM | POA: Diagnosis not present

## 2015-07-28 DIAGNOSIS — C549 Malignant neoplasm of corpus uteri, unspecified: Secondary | ICD-10-CM | POA: Diagnosis not present

## 2015-07-28 DIAGNOSIS — E669 Obesity, unspecified: Secondary | ICD-10-CM | POA: Diagnosis not present

## 2015-07-28 DIAGNOSIS — N824 Other female intestinal-genital tract fistulae: Secondary | ICD-10-CM | POA: Diagnosis not present

## 2015-07-28 DIAGNOSIS — K5790 Diverticulosis of intestine, part unspecified, without perforation or abscess without bleeding: Secondary | ICD-10-CM | POA: Diagnosis not present

## 2015-07-28 DIAGNOSIS — N823 Fistula of vagina to large intestine: Secondary | ICD-10-CM | POA: Diagnosis not present

## 2015-07-28 DIAGNOSIS — Z8542 Personal history of malignant neoplasm of other parts of uterus: Secondary | ICD-10-CM | POA: Diagnosis not present

## 2015-08-13 DIAGNOSIS — Z9079 Acquired absence of other genital organ(s): Secondary | ICD-10-CM | POA: Diagnosis not present

## 2015-08-13 DIAGNOSIS — E041 Nontoxic single thyroid nodule: Secondary | ICD-10-CM | POA: Diagnosis not present

## 2015-08-13 DIAGNOSIS — Z8544 Personal history of malignant neoplasm of other female genital organs: Secondary | ICD-10-CM | POA: Diagnosis not present

## 2015-08-13 DIAGNOSIS — N3289 Other specified disorders of bladder: Secondary | ICD-10-CM | POA: Diagnosis not present

## 2015-08-13 DIAGNOSIS — Z8542 Personal history of malignant neoplasm of other parts of uterus: Secondary | ICD-10-CM | POA: Diagnosis not present

## 2015-08-13 DIAGNOSIS — N823 Fistula of vagina to large intestine: Secondary | ICD-10-CM | POA: Diagnosis not present

## 2015-08-13 DIAGNOSIS — Z79899 Other long term (current) drug therapy: Secondary | ICD-10-CM | POA: Diagnosis not present

## 2015-08-13 DIAGNOSIS — Z888 Allergy status to other drugs, medicaments and biological substances status: Secondary | ICD-10-CM | POA: Diagnosis not present

## 2015-08-13 DIAGNOSIS — Z90710 Acquired absence of both cervix and uterus: Secondary | ICD-10-CM | POA: Diagnosis not present

## 2015-08-13 DIAGNOSIS — D1771 Benign lipomatous neoplasm of kidney: Secondary | ICD-10-CM | POA: Diagnosis not present

## 2015-08-13 DIAGNOSIS — N828 Other female genital tract fistulae: Secondary | ICD-10-CM | POA: Diagnosis not present

## 2015-08-13 DIAGNOSIS — I10 Essential (primary) hypertension: Secondary | ICD-10-CM | POA: Diagnosis not present

## 2015-08-13 DIAGNOSIS — Z90722 Acquired absence of ovaries, bilateral: Secondary | ICD-10-CM | POA: Diagnosis not present

## 2015-08-13 DIAGNOSIS — N898 Other specified noninflammatory disorders of vagina: Secondary | ICD-10-CM | POA: Diagnosis not present

## 2015-09-01 DIAGNOSIS — N823 Fistula of vagina to large intestine: Secondary | ICD-10-CM | POA: Diagnosis not present

## 2015-09-04 DIAGNOSIS — R109 Unspecified abdominal pain: Secondary | ICD-10-CM | POA: Diagnosis not present

## 2015-09-04 DIAGNOSIS — N823 Fistula of vagina to large intestine: Secondary | ICD-10-CM | POA: Diagnosis not present

## 2015-09-04 DIAGNOSIS — G8918 Other acute postprocedural pain: Secondary | ICD-10-CM | POA: Diagnosis not present

## 2015-09-04 DIAGNOSIS — K219 Gastro-esophageal reflux disease without esophagitis: Secondary | ICD-10-CM | POA: Diagnosis present

## 2015-09-04 DIAGNOSIS — F40240 Claustrophobia: Secondary | ICD-10-CM | POA: Diagnosis present

## 2015-09-04 DIAGNOSIS — Z6837 Body mass index (BMI) 37.0-37.9, adult: Secondary | ICD-10-CM | POA: Diagnosis not present

## 2015-09-04 DIAGNOSIS — F419 Anxiety disorder, unspecified: Secondary | ICD-10-CM | POA: Diagnosis present

## 2015-09-04 DIAGNOSIS — E669 Obesity, unspecified: Secondary | ICD-10-CM | POA: Diagnosis present

## 2015-09-04 DIAGNOSIS — Z79899 Other long term (current) drug therapy: Secondary | ICD-10-CM | POA: Diagnosis not present

## 2015-09-04 DIAGNOSIS — K5731 Diverticulosis of large intestine without perforation or abscess with bleeding: Secondary | ICD-10-CM | POA: Diagnosis not present

## 2015-09-04 DIAGNOSIS — N824 Other female intestinal-genital tract fistulae: Secondary | ICD-10-CM | POA: Diagnosis not present

## 2015-09-04 DIAGNOSIS — I10 Essential (primary) hypertension: Secondary | ICD-10-CM | POA: Diagnosis present

## 2015-09-04 DIAGNOSIS — K5732 Diverticulitis of large intestine without perforation or abscess without bleeding: Secondary | ICD-10-CM | POA: Diagnosis present

## 2015-09-04 DIAGNOSIS — Z8542 Personal history of malignant neoplasm of other parts of uterus: Secondary | ICD-10-CM | POA: Diagnosis not present

## 2015-09-18 DIAGNOSIS — K5730 Diverticulosis of large intestine without perforation or abscess without bleeding: Secondary | ICD-10-CM | POA: Diagnosis not present

## 2015-09-18 DIAGNOSIS — Z9889 Other specified postprocedural states: Secondary | ICD-10-CM | POA: Diagnosis not present

## 2015-09-18 DIAGNOSIS — Z48815 Encounter for surgical aftercare following surgery on the digestive system: Secondary | ICD-10-CM | POA: Diagnosis not present

## 2015-09-18 DIAGNOSIS — N823 Fistula of vagina to large intestine: Secondary | ICD-10-CM | POA: Diagnosis not present

## 2015-10-06 DIAGNOSIS — C549 Malignant neoplasm of corpus uteri, unspecified: Secondary | ICD-10-CM | POA: Diagnosis not present

## 2015-10-06 DIAGNOSIS — E669 Obesity, unspecified: Secondary | ICD-10-CM | POA: Diagnosis not present

## 2015-10-06 DIAGNOSIS — N321 Vesicointestinal fistula: Secondary | ICD-10-CM | POA: Diagnosis not present

## 2015-10-06 DIAGNOSIS — N823 Fistula of vagina to large intestine: Secondary | ICD-10-CM | POA: Diagnosis not present

## 2015-10-07 DIAGNOSIS — N321 Vesicointestinal fistula: Secondary | ICD-10-CM | POA: Insufficient documentation

## 2015-12-13 ENCOUNTER — Other Ambulatory Visit: Payer: Self-pay | Admitting: Family Medicine

## 2015-12-13 DIAGNOSIS — I10 Essential (primary) hypertension: Secondary | ICD-10-CM

## 2016-02-17 ENCOUNTER — Encounter: Payer: Self-pay | Admitting: Family Medicine

## 2016-02-17 ENCOUNTER — Ambulatory Visit (INDEPENDENT_AMBULATORY_CARE_PROVIDER_SITE_OTHER): Payer: Medicare Other | Admitting: Family Medicine

## 2016-02-17 VITALS — BP 144/88 | HR 96 | Temp 98.0°F | Resp 16 | Wt 217.0 lb

## 2016-02-17 DIAGNOSIS — I10 Essential (primary) hypertension: Secondary | ICD-10-CM

## 2016-02-17 DIAGNOSIS — R739 Hyperglycemia, unspecified: Secondary | ICD-10-CM | POA: Diagnosis not present

## 2016-02-17 DIAGNOSIS — Z23 Encounter for immunization: Secondary | ICD-10-CM | POA: Diagnosis not present

## 2016-02-17 DIAGNOSIS — C541 Malignant neoplasm of endometrium: Secondary | ICD-10-CM | POA: Diagnosis not present

## 2016-02-17 MED ORDER — TRIAMTERENE-HCTZ 37.5-25 MG PO CAPS: 1.0000 | ORAL_CAPSULE | Freq: Every day | ORAL | Status: DC

## 2016-02-17 NOTE — Progress Notes (Signed)
Subjective:    Patient ID: Kathleen Glenn, female    DOB: 26-Dec-1945, 70 y.o.   MRN: [id number]  Hypertension This is a chronic problem. The problem is controlled. Pertinent negatives include no chest pain, palpitations or shortness of breath. There are no associated agents to hypertension. Past treatments include diuretics. The current treatment provides significant (Runs high at [employer] office.  OK at home.  ) improvement. There are no compliance problems.    Also has questions about her vitamins and a glass of wine at night. Is no longer undergoing cancer treatment.  Feels well. Has follow up with Oncology in the fall. Does not feel she needs her labs done here.    Has had a lot of joint and muscle pain.   Did overdue it with pine bark.    Also, has a lesion on her forehead.  Does not hurt. Has not changed, but would like it rechecked.  Has been checked before and just wants reassurance.   Also want to clarify follow up.    Patient Active Problem List   Diagnosis Date Noted  . Colo-vesical fistula 10/07/2015  . Rectovaginal fistula 07/14/2015  . Vaginal anomaly 07/09/2015  . Acquired deformity of finger 12/25/2014  . Primary cancer of endometrium ([place]) 12/25/2014  . Blood glucose elevated 12/25/2014  . Adult BMI 30+ 12/25/2014  . Hemorrhage, postmenopausal 12/25/2014  . Basal cell papilloma 12/25/2014  . Leg varices 12/25/2014  . Essential hypertension 05/03/2013  . Abnormal EKG 05/03/2013  . Malignant neoplasm of corpus uteri, except isthmus ([place]) 03/29/2012   Past Medical History  Diagnosis Date  . Hypertension   . Endometrial cancer [facility]) 2013   Current Outpatient Prescriptions on File Prior to Visit  Medication Sig  . Biotin 1 MG CAPS Take by mouth.  . Multiple Vitamins-Minerals (MULTIVITAMIN PO) Take by mouth.  . Omega-3 Fatty Acids (FISH OIL) 1200 MG CAPS Take 1 capsule by mouth daily.  Marland Kitchen triamterene-hydrochlorothiazide (DYAZIDE) 37.5-25 MG capsule 1 (ONE) CAPSULE,  ORAL, DAILY   No current facility-administered medications on file prior to visit.   Allergies  Allergen Reactions  . Voltaren [Diclofenac Sodium] Swelling    Eye drops   Past Surgical History  Procedure Laterality Date  . Bilateral salpingoophorectomy  2013  . Cataract extraction  2005  . Abdominal hysterectomy  2013    BSO, Tumor Stage 1 Cancer  . Endometrial biopsy  08/15/2011    Dr. Avelina Laine;   . Cervical polypectomy  08/2011    Dr. Avelina Laine  . Hysteroscopy  08/2011    Dr. Avelina Laine   Social History   Social History  . Marital Status: Married    Spouse Name: N/A  . Number of Children: N/A  . Years of Education: N/A   Occupational History  . Not on file.   Social History Main Topics  . Smoking status: Never Smoker   . Smokeless tobacco: Never Used  . Alcohol Use: Yes     Comment: occasional glass of wine  . Drug Use: No  . Sexual Activity: Yes    Birth Control/ Protection: Surgical   Other Topics Concern  . Not on file   Social History Narrative   Family History  Problem Relation Age of Onset  . Arthritis Father   . Diabetes Father     "borderline"  . Hypertension Father   . Congestive Heart Failure Father   . Transient ischemic attack Father   . Throat cancer Father   . Skin  cancer Sister   . Diabetes Sister   . Thyroid nodules Sister   . Skin cancer Brother   . Transient ischemic attack Mother   . Skin cancer Sister      Review of Systems  Constitutional: Negative for fever, chills, diaphoresis, activity change, appetite change, fatigue and unexpected weight change.  Respiratory: Negative for cough, shortness of breath and wheezing.   Cardiovascular: Negative for chest pain, palpitations and leg swelling.  Musculoskeletal: Positive for myalgias and arthralgias.   BP 144/88 mmHg  Pulse 96  Temp(Src) 98 F (36.7 C) (Oral)  Resp 16  Wt 217 lb (98.431 kg)   Patient Active Problem List   Diagnosis Date Noted  . Colo-vesical  fistula 10/07/2015  . Rectovaginal fistula 07/14/2015  . Vaginal anomaly 07/09/2015  . Acquired deformity of finger 12/25/2014  . Primary cancer of endometrium ([place]) 12/25/2014  . Blood glucose elevated 12/25/2014  . Adult BMI 30+ 12/25/2014  . Hemorrhage, postmenopausal 12/25/2014  . Basal cell papilloma 12/25/2014  . Leg varices 12/25/2014  . Essential hypertension 05/03/2013  . Abnormal EKG 05/03/2013  . Malignant neoplasm of corpus uteri, except isthmus ([place]) 03/29/2012   Past Medical History  Diagnosis Date  . Hypertension   . Endometrial cancer [facility]) 2013   Current Outpatient Prescriptions on File Prior to Visit  Medication Sig  . Biotin 1 MG CAPS Take by mouth.  . Multiple Vitamins-Minerals (MULTIVITAMIN PO) Take by mouth.  . Omega-3 Fatty Acids (FISH OIL) 1200 MG CAPS Take 1 capsule by mouth daily.  Marland Kitchen triamterene-hydrochlorothiazide (DYAZIDE) 37.5-25 MG capsule 1 (ONE) CAPSULE, ORAL, DAILY   No current facility-administered medications on file prior to visit.   Allergies  Allergen Reactions  . Voltaren [Diclofenac Sodium] Swelling    Eye drops   Past Surgical History  Procedure Laterality Date  . Bilateral salpingoophorectomy  2013  . Cataract extraction  2005  . Abdominal hysterectomy  2013    BSO, Tumor Stage 1 Cancer  . Endometrial biopsy  08/15/2011    Dr. Avelina Laine;   . Cervical polypectomy  08/2011    Dr. Avelina Laine  . Hysteroscopy  08/2011    Dr. Avelina Laine   Social History   Social History  . Marital Status: Married    Spouse Name: N/A  . Number of Children: N/A  . Years of Education: N/A   Occupational History  . Not on file.   Social History Main Topics  . Smoking status: Never Smoker   . Smokeless tobacco: Never Used  . Alcohol Use: Yes     Comment: occasional glass of wine  . Drug Use: No  . Sexual Activity: Yes    Birth Control/ Protection: Surgical   Other Topics Concern  . Not on file   Social History Narrative    Family History  Problem Relation Age of Onset  . Arthritis Father   . Diabetes Father     "borderline"  . Hypertension Father   . Congestive Heart Failure Father   . Transient ischemic attack Father   . Throat cancer Father   . Skin cancer Sister   . Diabetes Sister   . Thyroid nodules Sister   . Skin cancer Brother   . Transient ischemic attack Mother   . Skin cancer Sister        Objective:   Physical Exam  Constitutional: She is oriented to person, place, and time. She appears well-developed and well-nourished.  Cardiovascular: Normal rate and regular rhythm.   Pulmonary/Chest: Effort  normal and breath sounds normal.  Neurological: She is alert and oriented to person, place, and time.  Skin:  2 small palpable cysts on scalp. Firm. Regular. Non-tender.   Psychiatric: She has a normal mood and affect. Her behavior is normal. Judgment and thought content normal.   BP 144/88 mmHg  Pulse 96  Temp(Src) 98 F (36.7 C) (Oral)  Resp 16  Wt 217 lb (98.431 kg)      Assessment & Plan:  1. Need for pneumococcal vaccination Given today.  - Pneumococcal polysaccharide vaccine 23-valent greater than or equal to 2yo subcutaneous/IM  2. Essential hypertension Elevated some today. Suspect white coat syndrome. Not elevated at home. Will monitor and follow up as needed.  - triamterene-hydrochlorothiazide (DYAZIDE) 37.5-25 MG capsule; Take 1 each (1 capsule total) by mouth daily.  Dispense: 90 capsule; Refill: 3  3. Primary cancer of endometrium ([place]) Stable after treatment. Will follow up as scheduled.    4. Blood glucose elevated Stable. Did not want recheck today. Will recheck with new provider.     Margarita Rana, MD

## 2016-04-21 DIAGNOSIS — C549 Malignant neoplasm of corpus uteri, unspecified: Secondary | ICD-10-CM | POA: Diagnosis not present

## 2016-06-14 ENCOUNTER — Encounter: Payer: Self-pay | Admitting: Physician Assistant

## 2016-06-14 ENCOUNTER — Ambulatory Visit (INDEPENDENT_AMBULATORY_CARE_PROVIDER_SITE_OTHER): Payer: Medicare Other | Admitting: Physician Assistant

## 2016-06-14 VITALS — BP 142/86 | HR 84 | Temp 98.3°F | Resp 16 | Ht 64.0 in | Wt 221.0 lb

## 2016-06-14 DIAGNOSIS — I10 Essential (primary) hypertension: Secondary | ICD-10-CM | POA: Diagnosis not present

## 2016-06-14 DIAGNOSIS — R739 Hyperglycemia, unspecified: Secondary | ICD-10-CM

## 2016-06-14 MED ORDER — TRIAMTERENE-HCTZ 37.5-25 MG PO CAPS: 1.0000 | ORAL_CAPSULE | Freq: Every day | ORAL | 3 refills | Status: DC

## 2016-06-14 NOTE — Patient Instructions (Signed)
DASH Eating Plan  DASH stands for "Dietary Approaches to Stop Hypertension." The DASH eating plan is a healthy eating plan that has been shown to reduce high blood pressure (hypertension). Additional health benefits may include reducing the risk of type 2 diabetes mellitus, heart disease, and stroke. The DASH eating plan may also help with weight loss.  WHAT DO I NEED TO KNOW ABOUT THE DASH EATING PLAN?  For the DASH eating plan, you will follow these general guidelines:  · Choose foods with a percent daily value for sodium of less than 5% (as listed on the food label).  · Use salt-free seasonings or herbs instead of table salt or sea salt.  · Check with your health care provider or pharmacist before using salt substitutes.  · Eat lower-sodium products, often labeled as "lower sodium" or "no salt added."  · Eat fresh foods.  · Eat more vegetables, fruits, and low-fat dairy products.  · Choose whole grains. Look for the word "whole" as the first word in the ingredient list.  · Choose fish and skinless chicken or turkey more often than red meat. Limit fish, poultry, and meat to 6 oz (170 g) each day.  · Limit sweets, desserts, sugars, and sugary drinks.  · Choose heart-healthy fats.  · Limit cheese to 1 oz (28 g) per day.  · Eat more home-cooked food and less restaurant, buffet, and fast food.  · Limit fried foods.  · Cook foods using methods other than frying.  · Limit canned vegetables. If you do use them, rinse them well to decrease the sodium.  · When eating at a restaurant, ask that your food be prepared with less salt, or no salt if possible.  WHAT FOODS CAN I EAT?  Seek help from a dietitian for individual calorie needs.  Grains  Whole grain or whole wheat bread. Brown rice. Whole grain or whole wheat pasta. Quinoa, bulgur, and whole grain cereals. Low-sodium cereals. Corn or whole wheat flour tortillas. Whole grain cornbread. Whole grain crackers. Low-sodium crackers.  Vegetables  Fresh or frozen vegetables  (raw, steamed, roasted, or grilled). Low-sodium or reduced-sodium tomato and vegetable juices. Low-sodium or reduced-sodium tomato sauce and paste. Low-sodium or reduced-sodium canned vegetables.   Fruits  All fresh, canned (in natural juice), or frozen fruits.  Meat and Other Protein Products  Ground beef (85% or leaner), grass-fed beef, or beef trimmed of fat. Skinless chicken or turkey. Ground chicken or turkey. Pork trimmed of fat. All fish and seafood. Eggs. Dried beans, peas, or lentils. Unsalted nuts and seeds. Unsalted canned beans.  Dairy  Low-fat dairy products, such as skim or 1% milk, 2% or reduced-fat cheeses, low-fat ricotta or cottage cheese, or plain low-fat yogurt. Low-sodium or reduced-sodium cheeses.  Fats and Oils  Tub margarines without trans fats. Light or reduced-fat mayonnaise and salad dressings (reduced sodium). Avocado. Safflower, olive, or canola oils. Natural peanut or almond butter.  Other  Unsalted popcorn and pretzels.  The items listed above may not be a complete list of recommended foods or beverages. Contact your dietitian for more options.  WHAT FOODS ARE NOT RECOMMENDED?  Grains  White bread. White pasta. White rice. Refined cornbread. Bagels and croissants. Crackers that contain trans fat.  Vegetables  Creamed or fried vegetables. Vegetables in a cheese sauce. Regular canned vegetables. Regular canned tomato sauce and paste. Regular tomato and vegetable juices.  Fruits  Dried fruits. Canned fruit in light or heavy syrup. Fruit juice.  Meat and Other Protein   Products  Fatty cuts of meat. Ribs, chicken wings, bacon, sausage, bologna, salami, chitterlings, fatback, hot dogs, bratwurst, and packaged luncheon meats. Salted nuts and seeds. Canned beans with salt.  Dairy  Whole or 2% milk, cream, half-and-half, and cream cheese. Whole-fat or sweetened yogurt. Full-fat cheeses or blue cheese. Nondairy creamers and whipped toppings. Processed cheese, cheese spreads, or cheese  curds.  Condiments  Onion and garlic salt, seasoned salt, table salt, and sea salt. Canned and packaged gravies. Worcestershire sauce. Tartar sauce. Barbecue sauce. Teriyaki sauce. Soy sauce, including reduced sodium. Steak sauce. Fish sauce. Oyster sauce. Cocktail sauce. Horseradish. Ketchup and mustard. Meat flavorings and tenderizers. Bouillon cubes. Hot sauce. Tabasco sauce. Marinades. Taco seasonings. Relishes.  Fats and Oils  Butter, stick margarine, lard, shortening, ghee, and bacon fat. Coconut, palm kernel, or palm oils. Regular salad dressings.  Other  Pickles and olives. Salted popcorn and pretzels.  The items listed above may not be a complete list of foods and beverages to avoid. Contact your dietitian for more information.  WHERE CAN I FIND MORE INFORMATION?  National Heart, Lung, and Blood Institute: www.nhlbi.nih.gov/health/health-topics/topics/dash/     This information is not intended to replace advice given to you by your health care provider. Make sure you discuss any questions you have with your health care provider.     Document Released: 08/11/2011 Document Revised: 09/12/2014 Document Reviewed: 06/26/2013  Elsevier Interactive Patient Education ©2016 Elsevier Inc.

## 2016-06-14 NOTE — Progress Notes (Signed)
Patient: Kathleen Glenn Female    DOB: [birth year]   70 y.o.   MRN: [id number] Visit Date: 06/14/2016  Today's Provider: Mar Daring, PA-C   Chief Complaint  Patient presents with  . Hypertension   Subjective:    HPI  Hypertension, follow-up:  BP Readings from Last 3 Encounters:  06/14/16 (!) 142/86  02/17/16 (!) 144/88  07/09/15 (!) 144/84    She was last seen for hypertension 4 months ago.  BP at that visit was 144/88. Management changes since that visit include no changes. She reports excellent compliance with treatment. She is not having side effects.  She is exercising. She is adherent to low salt diet.   Outside blood pressures are stable. She is experiencing none.  Patient denies chest pain and lower extremity edema.   Cardiovascular risk factors include advanced age (older than 80 for men, 74 for women) and obesity (BMI >= 30 kg/m2).  Use of agents associated with hypertension: none.     Weight trend: stable Wt Readings from Last 3 Encounters:  06/14/16 221 lb (100.2 kg)  02/17/16 217 lb (98.4 kg)  07/09/15 217 lb (98.4 kg)    Current diet: in general, a "healthy" diet    ------------------------------------------------------------------------      Allergies  Allergen Reactions  . Voltaren [Diclofenac Sodium] Swelling    Eye drops     Current Outpatient Prescriptions:  .  Biotin 1 MG CAPS, Take by mouth., Disp: , Rfl:  .  Calcium Carbonate-Vitamin D3 (CALCIUM 600/VITAMIN D) 600-400 MG-UNIT TABS, Take 1 tablet by mouth daily., Disp: , Rfl:  .  Omega-3 Fatty Acids (FISH OIL) 1200 MG CAPS, Take 1 capsule by mouth daily., Disp: , Rfl:  .  triamterene-hydrochlorothiazide (DYAZIDE) 37.5-25 MG capsule, Take 1 each (1 capsule total) by mouth daily., Disp: 90 capsule, Rfl: 3  Review of Systems  Constitutional: Negative.   HENT: Negative.   Respiratory: Negative.   Cardiovascular: Negative.   Gastrointestinal: Negative.     Musculoskeletal: Positive for arthralgias.  Neurological: Negative.     Social History  Substance Use Topics  . Smoking status: Never Smoker  . Smokeless tobacco: Never Used  . Alcohol use Yes     Comment: occasional glass of wine   Objective:   BP (!) 142/86 (BP Location: Left Arm, Patient Position: Sitting, Cuff Size: Large)   Pulse 84   Temp 98.3 F (36.8 C) (Oral)   Resp 16   Ht 5\' 4"  (1.626 m)   Wt 221 lb (100.2 kg)   BMI 37.93 kg/m   Physical Exam  Constitutional: She appears well-developed and well-nourished. No distress.  Neck: Normal range of motion. Neck supple. No JVD present. No tracheal deviation present. No thyromegaly present.  Cardiovascular: Normal rate, regular rhythm and normal heart sounds.  Exam reveals no gallop and no friction rub.   No murmur heard. Pulmonary/Chest: Effort normal and breath sounds normal. No respiratory distress. She has no wheezes. She has no rales.  Musculoskeletal: She exhibits no edema.  Lymphadenopathy:    She has no cervical adenopathy.  Skin: She is not diaphoretic.  Vitals reviewed.     Assessment & Plan:     1. Essential hypertension Stable. Diagnosis pulled for medication refill. Continue current medical treatment plan. Will check labs as below and f/u pending results. I will see her back in 3 months for her AWV/CPE.  - triamterene-hydrochlorothiazide (DYAZIDE) 37.5-25 MG capsule; Take 1 each (1 capsule total)  by mouth daily.  Dispense: 90 capsule; Refill: 3 - CBC w/Diff/Platelet - Comprehensive Metabolic Panel (CMET)  2. Blood glucose elevated Will check labs as below and f/u pending results. - Comprehensive Metabolic Panel (CMET) - HgB A1c       Mar Daring, PA-C  [place]

## 2016-06-15 ENCOUNTER — Telehealth: Payer: Self-pay

## 2016-06-15 LAB — COMPREHENSIVE METABOLIC PANEL
ALT: 10 IU/L (ref 0–32)
AST: 14 IU/L (ref 0–40)
Albumin/Globulin Ratio: 1.3 (ref 1.2–2.2)
Albumin: 4.2 g/dL (ref 3.6–4.8)
Alkaline Phosphatase: 74 IU/L (ref 39–117)
BILIRUBIN TOTAL: 0.4 mg/dL (ref 0.0–1.2)
BUN/Creatinine Ratio: 19 (ref 12–28)
BUN: 18 mg/dL (ref 8–27)
CALCIUM: 9.4 mg/dL (ref 8.7–10.3)
CHLORIDE: 99 mmol/L (ref 96–106)
CO2: 25 mmol/L (ref 18–29)
Creatinine, Ser: 0.97 mg/dL (ref 0.57–1.00)
GFR, EST AFRICAN AMERICAN: 69 mL/min/{1.73_m2} (ref >59)
GFR, EST NON AFRICAN AMERICAN: 60 mL/min/{1.73_m2} (ref >59)
GLUCOSE: 92 mg/dL (ref 65–99)
Globulin, Total: 3.2 g/dL (ref 1.5–4.5)
Potassium: 4 mmol/L (ref 3.5–5.2)
Sodium: 141 mmol/L (ref 134–144)
TOTAL PROTEIN: 7.4 g/dL (ref 6.0–8.5)

## 2016-06-15 LAB — CBC WITH DIFFERENTIAL/PLATELET
BASOS ABS: 0 10*3/uL (ref 0.0–0.2)
Basos: 0 %
EOS (ABSOLUTE): 0.1 10*3/uL (ref 0.0–0.4)
Eos: 1 %
Hematocrit: 40 % (ref 34.0–46.6)
Hemoglobin: 13.6 g/dL (ref 11.1–15.9)
IMMATURE GRANS (ABS): 0 10*3/uL (ref 0.0–0.1)
IMMATURE GRANULOCYTES: 0 %
LYMPHS: 20 %
Lymphocytes Absolute: 1.6 10*3/uL (ref 0.7–3.1)
MCH: 31.4 pg (ref 26.6–33.0)
MCHC: 34 g/dL (ref 31.5–35.7)
MCV: 92 fL (ref 79–97)
Monocytes Absolute: 0.4 10*3/uL (ref 0.1–0.9)
Monocytes: 5 %
NEUTROS PCT: 74 %
Neutrophils Absolute: 5.8 10*3/uL (ref 1.4–7.0)
PLATELETS: 326 10*3/uL (ref 150–379)
RBC: 4.33 x10E6/uL (ref 3.77–5.28)
RDW: 13.3 % (ref 12.3–15.4)
WBC: 8 10*3/uL (ref 3.4–10.8)

## 2016-06-15 LAB — HEMOGLOBIN A1C
Est. average glucose Bld gHb Est-mCnc: 126 mg/dL
Hgb A1c MFr Bld: 6 % — ABNORMAL HIGH (ref 4.8–5.6)

## 2016-06-15 NOTE — Telephone Encounter (Signed)
-----   Message from Mar Daring, [place] sent at 06/15/2016  9:11 AM EDT ----- All labs are within normal limits and stable with exception of A1c which has increased to 6.0 from 5.7. Try to limit simple carbohydrates and sugars from diet.  Will recheck in one year. Thanks! -JB

## 2016-06-15 NOTE — Telephone Encounter (Signed)
Patient advised as directed below.  Thanks,  -Nick Armel 

## 2016-10-11 ENCOUNTER — Encounter: Payer: Medicare Other | Admitting: Physician Assistant

## 2016-10-25 ENCOUNTER — Ambulatory Visit (INDEPENDENT_AMBULATORY_CARE_PROVIDER_SITE_OTHER): Payer: Medicare Other | Admitting: Physician Assistant

## 2016-10-25 ENCOUNTER — Encounter: Payer: Self-pay | Admitting: Physician Assistant

## 2016-10-25 VITALS — BP 160/88 | HR 80 | Temp 98.5°F | Resp 16 | Ht 64.0 in

## 2016-10-25 DIAGNOSIS — Z1231 Encounter for screening mammogram for malignant neoplasm of breast: Secondary | ICD-10-CM | POA: Diagnosis not present

## 2016-10-25 DIAGNOSIS — Z1211 Encounter for screening for malignant neoplasm of colon: Secondary | ICD-10-CM

## 2016-10-25 DIAGNOSIS — Z0000 Encounter for general adult medical examination without abnormal findings: Secondary | ICD-10-CM

## 2016-10-25 DIAGNOSIS — C541 Malignant neoplasm of endometrium: Secondary | ICD-10-CM | POA: Diagnosis not present

## 2016-10-25 DIAGNOSIS — Z780 Asymptomatic menopausal state: Secondary | ICD-10-CM | POA: Diagnosis not present

## 2016-10-25 DIAGNOSIS — R739 Hyperglycemia, unspecified: Secondary | ICD-10-CM

## 2016-10-25 DIAGNOSIS — Z13820 Encounter for screening for osteoporosis: Secondary | ICD-10-CM | POA: Diagnosis not present

## 2016-10-25 DIAGNOSIS — N321 Vesicointestinal fistula: Secondary | ICD-10-CM

## 2016-10-25 DIAGNOSIS — I10 Essential (primary) hypertension: Secondary | ICD-10-CM | POA: Diagnosis not present

## 2016-10-25 DIAGNOSIS — Z1239 Encounter for other screening for malignant neoplasm of breast: Secondary | ICD-10-CM

## 2016-10-25 DIAGNOSIS — N823 Fistula of vagina to large intestine: Secondary | ICD-10-CM

## 2016-10-25 NOTE — Patient Instructions (Signed)

## 2016-10-25 NOTE — Progress Notes (Addendum)
Patient: Kathleen Glenn, Female    DOB: 08-08-1946, 71 y.o.   MRN: [id number] Visit Date: 10/25/2016  Today's Provider: Mar Daring, PA-C   Chief Complaint  Patient presents with  . Medicare Wellness   Subjective:    Annual wellness visit Kathleen Glenn is a 71 y.o. female. She feels well. She reports exercising. She reports she is sleeping fairly well.  Declined flu vaccine. Last CPE:06/25/14;Patient has been having annual pelvic exams at [facility] following her diagnosis of uterine cancer in 2013. She underwent a laparoscopic hysterectomy in 2013. 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 She underwent surgery in 08/2015 to repair. She has been cleared by [facility] but it is recommended for her to have annual pelvic exams. Pap: 06/25/14-Negative Mammogram: 06/05/14 Bi-RADS 1 Colonoscopy: Patient has refused -----------------------------------------------------------   Review of Systems  Constitutional: Negative.   HENT: Positive for tinnitus.   Eyes: Negative.   Respiratory: Negative.   Cardiovascular: Negative.   Gastrointestinal: Negative.   Endocrine: Negative.   Genitourinary: Negative.   Musculoskeletal: Negative.   Skin: Negative.   Allergic/Immunologic: Negative.   Neurological: Negative.   Hematological: Negative.   Psychiatric/Behavioral: Negative.     Social History   Social History  . Marital status: Married    Spouse name: N/A  . Number of children: N/A  . Years of education: N/A   Occupational History  . Not on file.   Social History Main Topics  . Smoking status: Never Smoker  . Smokeless tobacco: Never Used  . Alcohol use Yes     Comment: occasional glass of wine  . Drug use: No  . Sexual activity: Yes    Birth control/ protection: Surgical   Other Topics Concern  . Not on file   Social History Narrative  . No narrative on file    Past Medical History:  Diagnosis Date  . Endometrial cancer  ([place]) 2013  . Hypertension      Patient Active Problem List   Diagnosis Date Noted  . Colo-vesical fistula 10/07/2015  . Rectovaginal fistula 07/14/2015  . Vaginal anomaly 07/09/2015  . Acquired deformity of finger 12/25/2014  . Primary cancer of endometrium ([place]) 12/25/2014  . Blood glucose elevated 12/25/2014  . Adult BMI 30+ 12/25/2014  . Hemorrhage, postmenopausal 12/25/2014  . Basal cell papilloma 12/25/2014  . Leg varices 12/25/2014  . Essential hypertension 05/03/2013  . Abnormal EKG 05/03/2013  . Malignant neoplasm of corpus uteri, except isthmus ([place]) 03/29/2012    Past Surgical History:  Procedure Laterality Date  . ABDOMINAL HYSTERECTOMY  2013   BSO, Tumor Stage 1 Cancer  . BILATERAL SALPINGOOPHORECTOMY  2013  . CATARACT EXTRACTION  2005  . CERVICAL POLYPECTOMY  08/2011   Dr. Avelina Laine  . ENDOMETRIAL BIOPSY  08/15/2011   Dr. Avelina Laine;   . HYSTEROSCOPY  08/2011   Dr. Avelina Laine    Her family history includes Arthritis in her father; Congestive Heart Failure in her father; Diabetes in her father and sister; Hypertension in her father; Skin cancer in her brother, sister, and sister; Throat cancer in her father; Thyroid nodules in her sister; Transient ischemic attack in her father and mother.      Current Outpatient Prescriptions:  .  Biotin 1 MG CAPS, Take by mouth., Disp: , Rfl:  .  Calcium Carbonate-Vitamin D3 (CALCIUM 600/VITAMIN D) 600-400 MG-UNIT TABS, Take 1 tablet by mouth daily., Disp: , Rfl:  .  Omega-3 Fatty Acids (  FISH OIL) 1200 MG CAPS, Take 1 capsule by mouth daily., Disp: , Rfl:  .  triamterene-hydrochlorothiazide (DYAZIDE) 37.5-25 MG capsule, Take 1 each (1 capsule total) by mouth daily., Disp: 90 capsule, Rfl: 3  Patient Care Team: Mar Daring, PA-C as PCP - General (Family Medicine)     Objective:   Vitals: BP (!) 160/88 (BP Location: Right Arm, Patient Position: Sitting, Cuff Size: Large) Comment: "white coat Syndrome"@  home Readings 130's-120's and 80-70's  Pulse 80   Temp 98.5 F (36.9 C) (Oral)   Resp 16   Ht 5\' 4"  (1.626 m)   Physical Exam  Constitutional: She is oriented to person, place, and time. She appears well-developed and well-nourished. No distress.  Obese  HENT:  Head: Normocephalic and atraumatic.  Right Ear: Tympanic membrane, external ear and ear canal normal.  Left Ear: Tympanic membrane, external ear and ear canal normal.  Nose: Nose normal.  Mouth/Throat: Uvula is midline, oropharynx is clear and moist and mucous membranes are normal. No oropharyngeal exudate.  Eyes: Conjunctivae and EOM are normal. Pupils are equal, round, and reactive to light. Right eye exhibits no discharge. Left eye exhibits no discharge. No scleral icterus.  Neck: Normal range of motion. Neck supple. No JVD present. Carotid bruit is not present. No tracheal deviation present. No thyromegaly present.  Cardiovascular: Normal rate, regular rhythm, normal heart sounds and intact distal pulses.  Exam reveals no gallop and no friction rub.   No murmur heard. Pulmonary/Chest: Effort normal and breath sounds normal. No respiratory distress. She has no wheezes. She has no rales. She exhibits no tenderness.  Abdominal: Soft. Bowel sounds are normal. She exhibits no distension and no mass. There is no tenderness. There is no rebound and no guarding.  Musculoskeletal: Normal range of motion. She exhibits no edema or tenderness.  Lymphadenopathy:    She has no cervical adenopathy.  Neurological: She is alert and oriented to person, place, and time.  Skin: Skin is warm and dry. No rash noted. She is not diaphoretic.  Psychiatric: She has a normal mood and affect. Her behavior is normal. Judgment and thought content normal.  Vitals reviewed.   Activities of Daily Living In your present state of health, do you have any difficulty performing the following activities: 10/25/2016 06/14/2016  Hearing? N N  Vision? N N    Difficulty concentrating or making decisions? N N  Walking or climbing stairs? N N  Dressing or bathing? N N  Doing errands, shopping? N N  Some recent data might be hidden    Fall Risk Assessment Fall Risk  10/25/2016 06/14/2016 02/04/2015 02/04/2015  Falls in the past year? No No No No     Depression Screen PHQ 2/9 Scores 10/25/2016 06/14/2016 02/04/2015 02/04/2015  PHQ - 2 Score 0 0 0 0    Cognitive Testing - 6-CIT  Correct? Score   What year is it? yes 0 0 or 4  What month is it? yes 0 0 or 3  Memorize:    [place]ia [place],  43,  [place],      What time is it? (within 1 hour) yes 0 0 or 3  Count backwards from 20 yes 0 0, 2, or 4  Name the months of the year yes 0 0, 2, or 4  Repeat name & address above yes 0 0, 2, 4, 6, 8, or 10       TOTAL SCORE  0/28   Interpretation:  Normal  Normal (0-7) Abnormal (8-28)   Audit-C Alcohol Use Screening  Question Answer Points  How often do you have alcoholic drink? 1 times monthly 1  On days you do drink alcohol, how many drinks do you typically consume? 1 0  How oftey will you drink 6 or more in a total? never 0  Total Score:  1   A score of 3 or more in women, and 4 or more in men indicates increased risk for alcohol abuse, EXCEPT if all of the points are from question 1.     Assessment & Plan:     Annual Wellness Visit  Reviewed patient's Family Medical History Reviewed and updated list of patient's medical providers Assessment of cognitive impairment was done Assessed patient's functional ability Established a written schedule for health screening [place] Completed and Reviewed  Exercise Activities and Dietary recommendations Goals    None      Immunization History  Administered Date(s) Administered  . Pneumococcal Conjugate-13 06/25/2014  . Pneumococcal Polysaccharide-23 02/17/2016  . Td 09/29/2011  . Tdap 09/29/2011    Health Maintenance  Topic Date Due  . MAMMOGRAM   06/01/1997  . COLONOSCOPY  06/01/1997  . DEXA SCAN  06/01/2012  . INFLUENZA VACCINE  04/05/2016  . TETANUS/TDAP  09/28/2021  . Hepatitis C Screening  Completed  . PNA vac Low Risk Adult  Completed     Discussed health benefits of physical activity, and encouraged her to engage in regular exercise appropriate for her age and condition.    1. Medicare annual wellness visit, subsequent Normal physical exam today.  2. Breast cancer screening There is no family history of breast cancer. She does perform regular self breast exams. Mammogram was ordered as below. Information for [facility] Breast clinic was given to patient so she may schedule her mammogram at her convenience. - MM Digital Screening; Future  3. Colon cancer screening Patient refuses colonoscopy. Discussed cologuard and gave information. Patient will call back if she wants cologuard ordered. She is agreeable to colonoscopy if cologuard is positive if she chooses to get colonoscopy.  4. Essential hypertension Elevated slightly today in the office, but normal readings at home. Patient has been having tinnitus and thinks it may be BP medication. She is going to do a trial of 2-3 days without medication to see if tinnitus improves. If so we will separate medication and try triamterene without HCTZ to see if this is better. Will check labs as below and f/u pending results. - CBC w/Diff/Platelet - Comprehensive Metabolic Panel (CMET) - TSH - Lipid Profile  5. Blood glucose elevated Will check labs as below and f/u pending results. - Comprehensive Metabolic Panel (CMET) - TSH  6. Postmenopausal estrogen deficiency Never had bone density. Order placed as below.  - DG Bone Density; Future  7. Osteoporosis screening See above medical treatment plan for #6. - DG Bone Density; Future  8. Primary cancer of endometrium [facility]) Will see her back in August for her pelvic exam and vaginal pap smear.   9. Rectovaginal fistula See  above medical treatment plan for #8.  10. Colo-vesical fistula See above medical treatment plan for #8.  ------------------------------------------------------------------------------------------------------------    Mar Daring, PA-C  [facility]

## 2016-10-26 LAB — CBC WITH DIFFERENTIAL/PLATELET
BASOS ABS: 0 10*3/uL (ref 0.0–0.2)
Basos: 1 %
EOS (ABSOLUTE): 0.1 10*3/uL (ref 0.0–0.4)
EOS: 1 %
HEMATOCRIT: 41.4 % (ref 34.0–46.6)
HEMOGLOBIN: 13.7 g/dL (ref 11.1–15.9)
IMMATURE GRANS (ABS): 0 10*3/uL (ref 0.0–0.1)
Immature Granulocytes: 0 %
LYMPHS: 16 %
Lymphocytes Absolute: 1.2 10*3/uL (ref 0.7–3.1)
MCH: 30.4 pg (ref 26.6–33.0)
MCHC: 33.1 g/dL (ref 31.5–35.7)
MCV: 92 fL (ref 79–97)
MONOCYTES: 8 %
Monocytes Absolute: 0.6 10*3/uL (ref 0.1–0.9)
NEUTROS ABS: 5.6 10*3/uL (ref 1.4–7.0)
Neutrophils: 74 %
Platelets: 313 10*3/uL (ref 150–379)
RBC: 4.51 x10E6/uL (ref 3.77–5.28)
RDW: 13.7 % (ref 12.3–15.4)
WBC: 7.5 10*3/uL (ref 3.4–10.8)

## 2016-10-26 LAB — LIPID PANEL
CHOL/HDL RATIO: 3.5 (ref 0.0–4.4)
Cholesterol, Total: 184 mg/dL (ref 100–199)
HDL: 53 mg/dL (ref >39)
LDL CALC: 112 — AB (ref 0–99)
Triglycerides: 94 mg/dL (ref 0–149)
VLDL CHOLESTEROL CAL: 19 (ref 5–40)

## 2016-10-26 LAB — COMPREHENSIVE METABOLIC PANEL
ALBUMIN: 4.1 g/dL (ref 3.6–4.8)
ALT: 14 IU/L (ref 0–32)
AST: 16 IU/L (ref 0–40)
Albumin/Globulin Ratio: 1.3 (ref 1.2–2.2)
Alkaline Phosphatase: 68 IU/L (ref 39–117)
BUN/Creatinine Ratio: 24 (ref 12–28)
BUN: 23 mg/dL (ref 8–27)
Bilirubin Total: 0.3 mg/dL (ref 0.0–1.2)
CO2: 23 mmol/L (ref 18–29)
CREATININE: 0.96 mg/dL (ref 0.57–1.00)
Calcium: 9.3 mg/dL (ref 8.7–10.3)
Chloride: 94 mmol/L — ABNORMAL LOW (ref 96–106)
GFR, EST AFRICAN AMERICAN: 70 (ref >59)
GFR, EST NON AFRICAN AMERICAN: 61 (ref >59)
GLOBULIN, TOTAL: 3.2 (ref 1.5–4.5)
GLUCOSE: 102 mg/dL — AB (ref 65–99)
Potassium: 4 mmol/L (ref 3.5–5.2)
SODIUM: 137 mmol/L (ref 134–144)
TOTAL PROTEIN: 7.3 g/dL (ref 6.0–8.5)

## 2016-10-26 LAB — TSH: TSH: 0.843 u[IU]/mL (ref 0.450–4.500)

## 2016-10-26 NOTE — Progress Notes (Signed)
Advised  ED 

## 2016-10-31 ENCOUNTER — Telehealth: Payer: Self-pay | Admitting: Emergency Medicine

## 2016-10-31 NOTE — Telephone Encounter (Signed)
Yes she may continue to stay off the medication but should continue to monitor her blood pressure readings. If readings increase over 140/90 she needs to call us to possibly start another BP medication.

## 2016-10-31 NOTE — Telephone Encounter (Signed)
Pt called back to let you know about how she doing off of the Triamterene- HCTZ. She stopped it and the ringing in her ears has gotten much better. She has been checking her BP and it has been running 119-130/70's/ She wants to know if she can continue to stay off the medication. Please advise.

## 2016-10-31 NOTE — Telephone Encounter (Signed)
Pt advised and agrees with treatment plan. Emily Drozdowski, CMA  

## 2016-11-03 ENCOUNTER — Telehealth: Payer: Self-pay | Admitting: Physician Assistant

## 2016-11-03 DIAGNOSIS — I10 Essential (primary) hypertension: Secondary | ICD-10-CM

## 2016-11-03 MED ORDER — LISINOPRIL 5 MG PO TABS: 5.0000 mg | ORAL_TABLET | Freq: Every day | ORAL | 0 refills | Status: DC

## 2016-11-03 NOTE — Telephone Encounter (Signed)
Pt went off her blood pressure med because of ringing in her ears.  She said her blood pressure has gone up to 140 on top.  She wants to know id you want her to go on something else.  Her call back is [phone]  Thank sTeri

## 2016-11-03 NOTE — Telephone Encounter (Signed)
Will send in lisinopril 5mg . Only sending 30 day to make sure she tolerates well.

## 2016-11-03 NOTE — Telephone Encounter (Signed)
Patient advised as below.  

## 2016-11-03 NOTE — Telephone Encounter (Signed)
Please review. Thank you. sd  

## 2016-11-30 ENCOUNTER — Telehealth: Payer: Self-pay | Admitting: Physician Assistant

## 2016-11-30 DIAGNOSIS — I10 Essential (primary) hypertension: Secondary | ICD-10-CM

## 2016-11-30 MED ORDER — TRIAMTERENE-HCTZ 37.5-25 MG PO TABS: 1.0000 | ORAL_TABLET | Freq: Every day | ORAL | 3 refills | Status: DC

## 2016-11-30 NOTE — Telephone Encounter (Signed)
Pt would like to talk to Kathleen Glenn redarding her blood pressure medication.  She has started back on her hctz.  She went off the  lisinopril (PRINIVIL,ZESTRIL) 5 MG tablet  Because it made her feel lightheaded, and her blood pressure went up.  Please advise  Thank sTeri

## 2016-11-30 NOTE — Telephone Encounter (Signed)
Triamterene-HCTZ 37.5-25mg  sent in to [facility]. Patient was still having tinnitus but less but having more side effects with lisnopril thus she wishes to go back on maxzide.

## 2016-11-30 NOTE — Telephone Encounter (Signed)
Please review  ED 

## 2016-12-13 ENCOUNTER — Ambulatory Visit
Admission: RE | Admit: 2016-12-13 | Discharge: 2016-12-13 | Disposition: A | Discharge status: Home / Self Care | Payer: Medicare Other | Source: Ambulatory Visit | Attending: Physician Assistant | Admitting: Physician Assistant

## 2016-12-13 DIAGNOSIS — Z1239 Encounter for other screening for malignant neoplasm of breast: Secondary | ICD-10-CM

## 2016-12-13 DIAGNOSIS — Z13820 Encounter for screening for osteoporosis: Secondary | ICD-10-CM | POA: Insufficient documentation

## 2016-12-13 DIAGNOSIS — Z780 Asymptomatic menopausal state: Secondary | ICD-10-CM | POA: Diagnosis not present

## 2016-12-13 DIAGNOSIS — M8588 Other specified disorders of bone density and structure, other site: Secondary | ICD-10-CM | POA: Insufficient documentation

## 2016-12-13 DIAGNOSIS — Z1231 Encounter for screening mammogram for malignant neoplasm of breast: Secondary | ICD-10-CM | POA: Insufficient documentation

## 2016-12-13 DIAGNOSIS — M85851 Other specified disorders of bone density and structure, right thigh: Secondary | ICD-10-CM | POA: Diagnosis not present

## 2016-12-14 NOTE — Progress Notes (Signed)
Advised  ED 

## 2017-01-10 ENCOUNTER — Telehealth: Payer: Self-pay | Admitting: Physician Assistant

## 2017-01-10 DIAGNOSIS — Z1211 Encounter for screening for malignant neoplasm of colon: Secondary | ICD-10-CM

## 2017-01-10 NOTE — Telephone Encounter (Signed)
Cologuard ordered

## 2017-01-10 NOTE — Telephone Encounter (Signed)
Pt is requesting to have the cologuard test.  BT#[phone]/[id number]

## 2017-01-10 NOTE — Telephone Encounter (Signed)
Last seen for CPE 10/25/16, please review chart. KW

## 2017-01-11 ENCOUNTER — Telehealth: Payer: Self-pay | Admitting: Physician Assistant

## 2017-01-11 NOTE — Telephone Encounter (Signed)
Order for cologuard faxed to [facility] °

## 2017-01-18 DIAGNOSIS — Z1211 Encounter for screening for malignant neoplasm of colon: Secondary | ICD-10-CM | POA: Diagnosis not present

## 2017-01-18 DIAGNOSIS — Z1212 Encounter for screening for malignant neoplasm of rectum: Secondary | ICD-10-CM | POA: Diagnosis not present

## 2017-01-26 LAB — COLOGUARD: Cologuard: NEGATIVE

## 2017-02-06 DIAGNOSIS — D1801 Hemangioma of skin and subcutaneous tissue: Secondary | ICD-10-CM | POA: Diagnosis not present

## 2017-02-06 DIAGNOSIS — Z1283 Encounter for screening for malignant neoplasm of skin: Secondary | ICD-10-CM | POA: Diagnosis not present

## 2017-02-06 DIAGNOSIS — D485 Neoplasm of uncertain behavior of skin: Secondary | ICD-10-CM | POA: Diagnosis not present

## 2017-02-06 DIAGNOSIS — D229 Melanocytic nevi, unspecified: Secondary | ICD-10-CM | POA: Diagnosis not present

## 2017-02-06 DIAGNOSIS — L821 Other seborrheic keratosis: Secondary | ICD-10-CM | POA: Diagnosis not present

## 2017-02-06 DIAGNOSIS — C44319 Basal cell carcinoma of skin of other parts of face: Secondary | ICD-10-CM | POA: Diagnosis not present

## 2017-02-06 DIAGNOSIS — Z808 Family history of malignant neoplasm of other organs or systems: Secondary | ICD-10-CM | POA: Diagnosis not present

## 2017-02-06 DIAGNOSIS — C4491 Basal cell carcinoma of skin, unspecified: Secondary | ICD-10-CM

## 2017-02-06 DIAGNOSIS — L578 Other skin changes due to chronic exposure to nonionizing radiation: Secondary | ICD-10-CM | POA: Diagnosis not present

## 2017-02-06 DIAGNOSIS — C44511 Basal cell carcinoma of skin of breast: Secondary | ICD-10-CM | POA: Diagnosis not present

## 2017-02-06 HISTORY — DX: Basal cell carcinoma of skin, unspecified: C44.91

## 2017-02-08 ENCOUNTER — Telehealth: Payer: Self-pay | Admitting: Physician Assistant

## 2017-02-08 NOTE — Telephone Encounter (Signed)
Pt stated that she spoke to someone with Cologuard and was advised they faxed our office the results on 01/26/17. Pt stated that she hasn't gotten the results and would like a nurse to call her with the results. Please advise. Thanks TNP

## 2017-02-08 NOTE — Telephone Encounter (Signed)
Patient was advised of Cologuard result- Negative

## 2017-03-20 DIAGNOSIS — L718 Other rosacea: Secondary | ICD-10-CM | POA: Diagnosis not present

## 2017-03-20 DIAGNOSIS — C44511 Basal cell carcinoma of skin of breast: Secondary | ICD-10-CM | POA: Diagnosis not present

## 2017-04-10 DIAGNOSIS — Z85828 Personal history of other malignant neoplasm of skin: Secondary | ICD-10-CM | POA: Diagnosis not present

## 2017-04-10 DIAGNOSIS — C44319 Basal cell carcinoma of skin of other parts of face: Secondary | ICD-10-CM | POA: Diagnosis not present

## 2017-10-12 ENCOUNTER — Telehealth: Payer: Self-pay | Admitting: Physician Assistant

## 2017-10-17 DIAGNOSIS — Z85828 Personal history of other malignant neoplasm of skin: Secondary | ICD-10-CM | POA: Diagnosis not present

## 2017-10-17 DIAGNOSIS — L718 Other rosacea: Secondary | ICD-10-CM | POA: Diagnosis not present

## 2017-10-17 DIAGNOSIS — L821 Other seborrheic keratosis: Secondary | ICD-10-CM | POA: Diagnosis not present

## 2017-10-17 DIAGNOSIS — D229 Melanocytic nevi, unspecified: Secondary | ICD-10-CM | POA: Diagnosis not present

## 2017-10-25 NOTE — Telephone Encounter (Signed)
Pt return called. Pt has Medicare and [employer] so I wasn't sure if they would cover on the same day and pt insisted that she get a CPE with PAP so I scheduled AWV on with NHA on 12/29/17 and CPE with PAP/ follow up to AWV on 01/01/18. Thanks TNP

## 2017-11-24 ENCOUNTER — Other Ambulatory Visit: Payer: Self-pay | Admitting: Physician Assistant

## 2017-11-24 DIAGNOSIS — I10 Essential (primary) hypertension: Secondary | ICD-10-CM

## 2017-12-21 ENCOUNTER — Telehealth: Payer: Self-pay

## 2017-12-21 NOTE — Telephone Encounter (Signed)
LM for pt to CB to reschedule her AWV on 12/29/17. I will be unavailable that afternoon. See if pt will r/s to 12/27/17 or 12/28/17. -MM

## 2017-12-22 NOTE — Telephone Encounter (Signed)
Pt returned call and rescheduled for 2 pm 12/28/17. Thanks TNP

## 2017-12-28 ENCOUNTER — Ambulatory Visit (INDEPENDENT_AMBULATORY_CARE_PROVIDER_SITE_OTHER): Payer: Medicare Other

## 2017-12-28 VITALS — BP 144/92 | HR 88 | Temp 98.3°F | Ht 64.0 in

## 2017-12-28 DIAGNOSIS — Z0000 Encounter for general adult medical examination without abnormal findings: Secondary | ICD-10-CM

## 2017-12-28 NOTE — Progress Notes (Signed)
Subjective:   Kathleen Glenn is a 72 y.o. female who presents for Medicare Annual (Subsequent) preventive examination.  Review of Systems:  N/A Cardiac Risk Factors include: advanced age (>15men, >19 women);hypertension;obesity (BMI >30kg/m2)     Objective:     Vitals: BP (!) 144/92 (BP Location: Right Arm)   Pulse 88   Temp 98.3 F (36.8 C) (Oral)   Ht 5\' 4"  (1.626 m)   BMI 37.08 kg/m   Body mass index is 37.08 kg/m.  Advanced Directives 12/28/2017 10/25/2016 02/17/2016 07/09/2015  Does Patient Have a Medical Advance Directive? No No No No  Would patient like information on creating a medical advance directive? No - Patient declined - - -    Tobacco Social History   Tobacco Use  Smoking Status Never Smoker  Smokeless Tobacco Never Used     Counseling given: Not Answered   Clinical Intake:  Pre-visit preparation completed: Yes  Pain : No/denies pain Pain Score: 0-No pain     Nutritional Status: BMI > 30  Obese Nutritional Risks: None Diabetes: No  How often do you need to have someone help you when you read instructions, pamphlets, or other written materials from your doctor or pharmacy?: 1 - Never  Interpreter Needed?: No  Information entered by :: [facility], LPN  Past Medical History:  Diagnosis Date  . Endometrial cancer ([place]) 2013  . Hypertension    Past Surgical History:  Procedure Laterality Date  . ABDOMINAL HYSTERECTOMY  2013   BSO, Tumor Stage 1 Cancer  . BILATERAL SALPINGOOPHORECTOMY  2013  . CATARACT EXTRACTION  2005  . CERVICAL POLYPECTOMY  08/2011   Dr. Avelina Laine  . ENDOMETRIAL BIOPSY  08/15/2011   Dr. Avelina Laine;   . HYSTEROSCOPY  08/2011   Dr. Avelina Laine   Family History  Problem Relation Age of Onset  . Arthritis Father   . Diabetes Father        "borderline"  . Hypertension Father   . Congestive Heart Failure Father   . Transient ischemic attack Father   . Throat cancer Father   . Skin cancer Sister   .  Diabetes Sister   . Thyroid nodules Sister   . Skin cancer Brother   . Transient ischemic attack Mother   . Skin cancer Sister   . Breast cancer Neg Hx    Social History   Socioeconomic History  . Marital status: Married    Spouse name: Not on file  . Number of children: 2  . Years of education: Not on file  . Highest education level: Some college, no degree  Occupational History  . Not on file  Social Needs  . Financial resource strain: Not hard at all  . Food insecurity:    Worry: Never true    Inability: Never true  . Transportation needs:    Medical: No    Non-medical: No  Tobacco Use  . Smoking status: Never Smoker  . Smokeless tobacco: Never Used  Substance and Sexual Activity  . Alcohol use: Yes    Comment: occasional glass of wine  . Drug use: No  . Sexual activity: Yes    Birth control/protection: Surgical  Lifestyle  . Physical activity:    Days per week: Not on file    Minutes per session: Not on file  . Stress: Not at all  Relationships  . Social connections:    Talks on phone: Not on file    Gets together: Not on file  Attends religious service: Not on file    Active member of club or organization: Not on file    Attends meetings of clubs or organizations: Not on file    Relationship status: Not on file  Other Topics Concern  . Not on file  Social History Narrative  . Not on file    Outpatient Encounter Medications as of 12/28/2017  Medication Sig  . Biotin 1 MG CAPS Take by mouth daily.   . Calcium Carbonate-Vitamin D3 (CALCIUM 600/VITAMIN D) 600-400 MG-UNIT TABS Take 1 tablet by mouth daily.  . Omega-3 Fatty Acids (FISH OIL) 1200 MG CAPS Take 1 capsule by mouth daily.  Marland Kitchen RA TURMERIC PO Take by mouth daily.  Marland Kitchen triamterene-hydrochlorothiazide (MAXZIDE-25) 37.5-25 MG tablet TAKE 1 TABLET BY MOUTH DAILY.   No facility-administered encounter medications on file as of 12/28/2017.     Activities of Daily Living In your present state of health,  do you have any difficulty performing the following activities: 12/28/2017  Hearing? N  Vision? N  Difficulty concentrating or making decisions? N  Walking or climbing stairs? N  Dressing or bathing? N  Doing errands, shopping? N  Preparing Food and eating ? N  Using the Toilet? N  In the past six months, have you accidently leaked urine? Y  Comment Occasionally, wears protection.  Do you have problems with loss of bowel control? N  Managing your Medications? N  Managing your Finances? N  Housekeeping or managing your Housekeeping? N  Some recent data might be hidden    Patient Care Team: Mar Daring, PA-C as PCP - General (Family Medicine) Brendolyn Patty, MD as Consulting Physician (Dermatology)    Assessment:   This is a routine wellness examination for [place].  Exercise Activities and Dietary recommendations Current Exercise Habits: Home exercise routine, Type of exercise: walking, Time (Minutes): 45, Frequency (Times/Week): 3(to 4 days a week), Weekly Exercise (Minutes/Week): 135, Intensity: Mild, Exercise limited by: None identified  Goals    . DIET - EAT MORE FRUITS AND VEGETABLES     Recommend to continue increasing fruits and vegetables in daily diet (2 servings each a day).       Fall Risk Fall Risk  12/28/2017 10/25/2016 06/14/2016 02/04/2015 02/04/2015  Falls in the past year? No No No No No   Is the patient's home free of loose throw rugs in walkways, pet beds, electrical cords, etc?   yes      Grab bars in the bathroom? no      Handrails on the stairs?   yes      Adequate lighting?   yes  Timed Get Up and Go performed: N/A  Depression Screen PHQ 2/9 Scores 12/28/2017 10/25/2016 06/14/2016 02/04/2015  PHQ - 2 Score 0 0 0 0     Cognitive Function: Pt declined screening today.         Immunization History  Administered Date(s) Administered  . Influenza Split 09/29/2011  . Pneumococcal Conjugate-13 06/25/2014  . Pneumococcal Polysaccharide-23  02/17/2016  . Td 09/29/2011  . Tdap 09/29/2011    Qualifies for Shingles Vaccine?Due for Shingles vaccine. Declined my offer to administer today. Education has been provided regarding the importance of this vaccine. Pt has been advised to call her insurance company to determine her out of pocket expense. Advised she may also receive this vaccine at her local pharmacy or Health Dept. Verbalized acceptance and understanding.  Screening Tests Health Maintenance  Topic Date Due  . INFLUENZA VACCINE  04/05/2018  .  MAMMOGRAM  12/14/2018  . Fecal DNA (Cologuard)  01/27/2020  . TETANUS/TDAP  09/28/2021  . DEXA SCAN  Completed  . Hepatitis C Screening  Completed  . PNA vac Low Risk Adult  Completed    Cancer Screenings: Lung: Low Dose CT Chest recommended if Age 49-80 years, 30 pack-year currently smoking OR have quit w/in 15years. Patient does not qualify. Breast:  Up to date on Mammogram? Yes   Up to date of Bone Density/Dexa? Yes Colorectal: Up to date  Additional Screenings:  Hepatitis C Screening: Up to date     Plan:  I have personally reviewed and addressed the Medicare Annual Wellness questionnaire and have noted the following in the patient's chart:  A. Medical and social history B. Use of alcohol, tobacco or illicit drugs  C. Current medications and supplements D. Functional ability and status E.  Nutritional status F.  Physical activity G. Advance directives H. List of other physicians I.  Hospitalizations, surgeries, and ER visits in previous 12 months J.  [place] such as hearing and vision if needed, cognitive and depression L. Referrals and appointments - none  In addition, I have reviewed and discussed with patient certain preventive protocols, quality metrics, and best practice recommendations. A written personalized care plan for preventive services as well as general preventive health recommendations were provided to patient.  See attached scanned  questionnaire for additional information.   Signed,  Fabio Neighbors, LPN Nurse Health Advisor   Nurse Recommendations: Pt declined weight and 2nd BP reading today. Recommended pt to monitor BP at home and record readings. Pt to f/u with PCP at next OV.

## 2017-12-28 NOTE — Patient Instructions (Addendum)
Kathleen Glenn , Thank you for taking time to come for your Medicare Wellness Visit. I appreciate your ongoing commitment to your health goals. Please review the following plan we discussed and let me know if I can assist you in the future.   Screening recommendations/referrals: Colonoscopy: Up to date Mammogram: Up to date Bone Density: Up to date Recommended yearly ophthalmology/optometry visit for glaucoma screening and checkup Recommended yearly dental visit for hygiene and checkup  Vaccinations: Influenza vaccine: Up to date Pneumococcal vaccine: Up to date Tdap vaccine: Up to date Shingles vaccine: Pt declines today.     Advanced directives: Advance directive discussed with you today. Even though you declined this today please call our office should you need any additional help.  Conditions/risks identified: Obesity- recommend to continue increasing fruits and vegetables in daily diet (2 servings each a day).  Next appointment: 2:00 PM on 01/01/18 with Kathleen Glenn. Pt declined scheduling an apt for 2020.    Preventive Care 65 Years and Older, Female Preventive care refers to lifestyle choices and visits with your health care provider that can promote health and wellness. What does preventive care include?  A yearly physical exam. This is also called an annual well check.  Dental exams once or twice a year.  Routine eye exams. Ask your health care provider how often you should have your eyes checked.  Personal lifestyle choices, including:  Daily care of your teeth and gums.  Regular physical activity.  Eating a healthy diet.  Avoiding tobacco and drug use.  Limiting alcohol use.  Practicing safe sex.  Taking low-dose aspirin every day.  Taking vitamin and mineral supplements as recommended by your health care provider. What happens during an annual well check? The services and screenings done by your health care provider during your annual well check will  depend on your age, overall health, lifestyle risk factors, and family history of disease. Counseling  Your health care provider may ask you questions about your:  Alcohol use.  Tobacco use.  Drug use.  Emotional well-being.  Home and relationship well-being.  Sexual activity.  Eating habits.  History of falls.  Memory and ability to understand (cognition).  Work and work Statistician.  Reproductive health. Screening  You may have the following tests or measurements:  Height, weight, and BMI.  Blood pressure.  Lipid and cholesterol levels. These may be checked every 5 years, or more frequently if you are over 69 years old.  Skin check.  Lung cancer screening. You may have this screening every year starting at age 64 if you have a 30-pack-year history of smoking and currently smoke or have quit within the past 15 years.  Fecal occult blood test (FOBT) of the stool. You may have this test every year starting at age 13.  Flexible sigmoidoscopy or colonoscopy. You may have a sigmoidoscopy every 5 years or a colonoscopy every 10 years starting at age 44.  Hepatitis C blood test.  Hepatitis B blood test.  Sexually transmitted disease (STD) testing.  Diabetes screening. This is done by checking your blood sugar (glucose) after you have not eaten for a while (fasting). You may have this done every 1-3 years.  Bone density scan. This is done to screen for osteoporosis. You may have this done starting at age 52.  Mammogram. This may be done every 1-2 years. Talk to your health care provider about how often you should have regular mammograms. Talk with your health care provider about your test results,  treatment options, and if necessary, the need for more tests. Vaccines  Your health care provider may recommend certain vaccines, such as:  Influenza vaccine. This is recommended every year.  Tetanus, diphtheria, and acellular pertussis (Tdap, Td) vaccine. You may need a  Td booster every 10 years.  Zoster vaccine. You may need this after age 52.  Pneumococcal 13-valent conjugate (PCV13) vaccine. One dose is recommended after age 68.  Pneumococcal polysaccharide (PPSV23) vaccine. One dose is recommended after age 14. Talk to your health care provider about which screenings and vaccines you need and how often you need them. This information is not intended to replace advice given to you by your health care provider. Make sure you discuss any questions you have with your health care provider. Document Released: 09/18/2015 Document Revised: 05/11/2016 Document Reviewed: 06/23/2015 Elsevier Interactive Patient Education  2017 [employer] Prevention in the Home Falls can cause injuries. They can happen to people of all ages. There are many things you can do to make your home safe and to help prevent falls. What can I do on the outside of my home?  Regularly fix the edges of walkways and driveways and fix any cracks.  Remove anything that might make you trip as you walk through a door, such as a raised step or threshold.  Trim any bushes or trees on the path to your home.  Use bright outdoor lighting.  Clear any walking paths of anything that might make someone trip, such as rocks or tools.  Regularly check to see if handrails are loose or broken. Make sure that both sides of any steps have handrails.  Any raised decks and porches should have guardrails on the edges.  Have any leaves, snow, or ice cleared regularly.  Use sand or salt on walking paths during winter.  Clean up any spills in your garage right away. This includes oil or grease spills. What can I do in the bathroom?  Use night lights.  Install grab bars by the toilet and in the tub and shower. Do not use towel bars as grab bars.  Use non-skid mats or decals in the tub or shower.  If you need to sit down in the shower, use a plastic, non-slip stool.  Keep the floor dry. Clean  up any water that spills on the floor as soon as it happens.  Remove soap buildup in the tub or shower regularly.  Attach bath mats securely with double-sided non-slip rug tape.  Do not have throw rugs and other things on the floor that can make you trip. What can I do in the bedroom?  Use night lights.  Make sure that you have a light by your bed that is easy to reach.  Do not use any sheets or blankets that are too big for your bed. They should not hang down onto the floor.  Have a firm chair that has side arms. You can use this for support while you get dressed.  Do not have throw rugs and other things on the floor that can make you trip. What can I do in the kitchen?  Clean up any spills right away.  Avoid walking on wet floors.  Keep items that you use a lot in easy-to-reach places.  If you need to reach something above you, use a strong step stool that has a grab bar.  Keep electrical cords out of the way.  Do not use floor polish or wax that makes floors  slippery. If you must use wax, use non-skid floor wax.  Do not have throw rugs and other things on the floor that can make you trip. What can I do with my stairs?  Do not leave any items on the stairs.  Make sure that there are handrails on both sides of the stairs and use them. Fix handrails that are broken or loose. Make sure that handrails are as long as the stairways.  Check any carpeting to make sure that it is firmly attached to the stairs. Fix any carpet that is loose or worn.  Avoid having throw rugs at the top or bottom of the stairs. If you do have throw rugs, attach them to the floor with carpet tape.  Make sure that you have a light switch at the top of the stairs and the bottom of the stairs. If you do not have them, ask someone to add them for you. What else can I do to help prevent falls?  Wear shoes that:  Do not have high heels.  Have rubber bottoms.  Are comfortable and fit you well.  Are  closed at the toe. Do not wear sandals.  If you use a stepladder:  Make sure that it is fully opened. Do not climb a closed stepladder.  Make sure that both sides of the stepladder are locked into place.  Ask someone to hold it for you, if possible.  Clearly mark and make sure that you can see:  Any grab bars or handrails.  First and last steps.  Where the edge of each step is.  Use tools that help you move around (mobility aids) if they are needed. These include:  Canes.  Walkers.  Scooters.  Crutches.  Turn on the lights when you go into a dark area. Replace any light bulbs as soon as they burn out.  Set up your furniture so you have a clear path. Avoid moving your furniture around.  If any of your floors are uneven, fix them.  If there are any pets around you, be aware of where they are.  Review your medicines with your doctor. Some medicines can make you feel dizzy. This can increase your chance of falling. Ask your doctor what other things that you can do to help prevent falls. This information is not intended to replace advice given to you by your health care provider. Make sure you discuss any questions you have with your health care provider. Document Released: 06/18/2009 Document Revised: 01/28/2016 Document Reviewed: 09/26/2014 Elsevier Interactive Patient Education  2017 [employer].

## 2017-12-29 ENCOUNTER — Ambulatory Visit: Payer: Medicare Other

## 2018-01-01 ENCOUNTER — Encounter: Payer: Self-pay | Admitting: Physician Assistant

## 2018-01-01 ENCOUNTER — Other Ambulatory Visit: Payer: Self-pay

## 2018-01-01 ENCOUNTER — Ambulatory Visit (INDEPENDENT_AMBULATORY_CARE_PROVIDER_SITE_OTHER): Payer: Medicare Other | Admitting: Physician Assistant

## 2018-01-01 VITALS — BP 158/84 | HR 97 | Ht 63.0 in | Wt 208.0 lb

## 2018-01-01 DIAGNOSIS — Z0000 Encounter for general adult medical examination without abnormal findings: Secondary | ICD-10-CM | POA: Diagnosis not present

## 2018-01-01 DIAGNOSIS — I10 Essential (primary) hypertension: Secondary | ICD-10-CM

## 2018-01-01 DIAGNOSIS — R7309 Other abnormal glucose: Secondary | ICD-10-CM

## 2018-01-01 DIAGNOSIS — Z1231 Encounter for screening mammogram for malignant neoplasm of breast: Secondary | ICD-10-CM

## 2018-01-01 DIAGNOSIS — N321 Vesicointestinal fistula: Secondary | ICD-10-CM | POA: Diagnosis not present

## 2018-01-01 DIAGNOSIS — Z1239 Encounter for other screening for malignant neoplasm of breast: Secondary | ICD-10-CM

## 2018-01-01 DIAGNOSIS — Z6836 Body mass index (BMI) 36.0-36.9, adult: Secondary | ICD-10-CM

## 2018-01-01 DIAGNOSIS — E782 Mixed hyperlipidemia: Secondary | ICD-10-CM

## 2018-01-01 DIAGNOSIS — C549 Malignant neoplasm of corpus uteri, unspecified: Secondary | ICD-10-CM | POA: Diagnosis not present

## 2018-01-01 DIAGNOSIS — N823 Fistula of vagina to large intestine: Secondary | ICD-10-CM | POA: Diagnosis not present

## 2018-01-01 DIAGNOSIS — Z1272 Encounter for screening for malignant neoplasm of vagina: Secondary | ICD-10-CM

## 2018-01-01 NOTE — Patient Instructions (Signed)
Health Maintenance for Postmenopausal Women Menopause is a normal process in which your reproductive ability comes to an end. This process happens gradually over a span of months to years, usually between the ages of 22 and 9. Menopause is complete when you have missed 12 consecutive menstrual periods. It is important to talk with your health care provider about some of the most common conditions that affect postmenopausal women, such as heart disease, cancer, and bone loss (osteoporosis). Adopting a healthy lifestyle and getting preventive care can help to promote your health and wellness. Those actions can also lower your chances of developing some of these common conditions. What should I know about menopause? During menopause, you may experience a number of symptoms, such as:  Moderate-to-severe hot flashes.  Night sweats.  Decrease in sex drive.  Mood swings.  Headaches.  Tiredness.  Irritability.  Memory problems.  Insomnia.  Choosing to treat or not to treat menopausal changes is an individual decision that you make with your health care provider. What should I know about hormone replacement therapy and supplements? Hormone therapy products are effective for treating symptoms that are associated with menopause, such as hot flashes and night sweats. Hormone replacement carries certain risks, especially as you become older. If you are thinking about using estrogen or estrogen with progestin treatments, discuss the benefits and risks with your health care provider. What should I know about heart disease and stroke? Heart disease, heart attack, and stroke become more likely as you age. This may be due, in part, to the hormonal changes that your body experiences during menopause. These can affect how your body processes dietary fats, triglycerides, and cholesterol. Heart attack and stroke are both medical emergencies. There are many things that you can do to help prevent heart disease  and stroke:  Have your blood pressure checked at least every 1-2 years. High blood pressure causes heart disease and increases the risk of stroke.  If you are 53-22 years old, ask your health care provider if you should take aspirin to prevent a heart attack or a stroke.  Do not use any tobacco products, including cigarettes, chewing tobacco, or electronic cigarettes. If you need help quitting, ask your health care provider.  It is important to eat a healthy diet and maintain a healthy weight. ? Be sure to include plenty of vegetables, fruits, low-fat dairy products, and lean protein. ? Avoid eating foods that are high in solid fats, added sugars, or salt (sodium).  Get regular exercise. This is one of the most important things that you can do for your health. ? Try to exercise for at least 150 minutes each week. The type of exercise that you do should increase your heart rate and make you sweat. This is known as moderate-intensity exercise. ? Try to do strengthening exercises at least twice each week. Do these in addition to the moderate-intensity exercise.  Know your numbers.Ask your health care provider to check your cholesterol and your blood glucose. Continue to have your blood tested as directed by your health care provider.  What should I know about cancer screening? There are several types of cancer. Take the following steps to reduce your risk and to catch any cancer development as early as possible. Breast Cancer  Practice breast self-awareness. ? This means understanding how your breasts normally appear and feel. ? It also means doing regular breast self-exams. Let your health care provider know about any changes, no matter how small.  If you are 40  or older, have a clinician do a breast exam (clinical breast exam or CBE) every year. Depending on your age, family history, and medical history, it may be recommended that you also have a yearly breast X-ray (mammogram).  If you  have a family history of breast cancer, talk with your health care provider about genetic screening.  If you are at high risk for breast cancer, talk with your health care provider about having an MRI and a mammogram every year.  Breast cancer (BRCA) gene test is recommended for women who have family members with BRCA-related cancers. Results of the assessment will determine the need for genetic counseling and BRCA1 and for BRCA2 testing. BRCA-related cancers include these types: ? Breast. This occurs in males or females. ? Ovarian. ? Tubal. This may also be called fallopian tube cancer. ? Cancer of the abdominal or pelvic lining (peritoneal cancer). ? Prostate. ? Pancreatic.  Cervical, Uterine, and Ovarian Cancer Your health care provider may recommend that you be screened regularly for cancer of the pelvic organs. These include your ovaries, uterus, and vagina. This screening involves a pelvic exam, which includes checking for microscopic changes to the surface of your cervix (Pap test).  For women ages 21-65, health care providers may recommend a pelvic exam and a Pap test every three years. For women ages 79-65, they may recommend the Pap test and pelvic exam, combined with testing for human papilloma virus (HPV), every five years. Some types of HPV increase your risk of cervical cancer. Testing for HPV may also be done on women of any age who have unclear Pap test results.  Other health care providers may not recommend any screening for nonpregnant women who are considered low risk for pelvic cancer and have no symptoms. Ask your health care provider if a screening pelvic exam is right for you.  If you have had past treatment for cervical cancer or a condition that could lead to cancer, you need Pap tests and screening for cancer for at least 20 years after your treatment. If Pap tests have been discontinued for you, your risk factors (such as having a new sexual partner) need to be  reassessed to determine if you should start having screenings again. Some women have medical problems that increase the chance of getting cervical cancer. In these cases, your health care provider may recommend that you have screening and Pap tests more often.  If you have a family history of uterine cancer or ovarian cancer, talk with your health care provider about genetic screening.  If you have vaginal bleeding after reaching menopause, tell your health care provider.  There are currently no reliable tests available to screen for ovarian cancer.  Lung Cancer Lung cancer screening is recommended for adults 69-62 years old who are at high risk for lung cancer because of a history of smoking. A yearly low-dose CT scan of the lungs is recommended if you:  Currently smoke.  Have a history of at least 30 pack-years of smoking and you currently smoke or have quit within the past 15 years. A pack-year is smoking an average of one pack of cigarettes per day for one year.  Yearly screening should:  Continue until it has been 15 years since you quit.  Stop if you develop a health problem that would prevent you from having lung cancer treatment.  Colorectal Cancer  This type of cancer can be detected and can often be prevented.  Routine colorectal cancer screening usually begins at  age 42 and continues through age 45.  If you have risk factors for colon cancer, your health care provider may recommend that you be screened at an earlier age.  If you have a family history of colorectal cancer, talk with your health care provider about genetic screening.  Your health care provider may also recommend using home test kits to check for hidden blood in your stool.  A small camera at the end of a tube can be used to examine your colon directly (sigmoidoscopy or colonoscopy). This is done to check for the earliest forms of colorectal cancer.  Direct examination of the colon should be repeated every  5-10 years until age 71. However, if early forms of precancerous polyps or small growths are found or if you have a family history or genetic risk for colorectal cancer, you may need to be screened more often.  Skin Cancer  Check your skin from head to toe regularly.  Monitor any moles. Be sure to tell your health care provider: ? About any new moles or changes in moles, especially if there is a change in a mole's shape or color. ? If you have a mole that is larger than the size of a pencil eraser.  If any of your family members has a history of skin cancer, especially at a young age, talk with your health care provider about genetic screening.  Always use sunscreen. Apply sunscreen liberally and repeatedly throughout the day.  Whenever you are outside, protect yourself by wearing long sleeves, pants, a wide-brimmed hat, and sunglasses.  What should I know about osteoporosis? Osteoporosis is a condition in which bone destruction happens more quickly than new bone creation. After menopause, you may be at an increased risk for osteoporosis. To help prevent osteoporosis or the bone fractures that can happen because of osteoporosis, the following is recommended:  If you are 46-71 years old, get at least 1,000 mg of calcium and at least 600 mg of vitamin D per day.  If you are older than age 55 but younger than age 65, get at least 1,200 mg of calcium and at least 600 mg of vitamin D per day.  If you are older than age 54, get at least 1,200 mg of calcium and at least 800 mg of vitamin D per day.  Smoking and excessive alcohol intake increase the risk of osteoporosis. Eat foods that are rich in calcium and vitamin D, and do weight-bearing exercises several times each week as directed by your health care provider. What should I know about how menopause affects my mental health? Depression may occur at any age, but it is more common as you become older. Common symptoms of depression  include:  Low or sad mood.  Changes in sleep patterns.  Changes in appetite or eating patterns.  Feeling an overall lack of motivation or enjoyment of activities that you previously enjoyed.  Frequent crying spells.  Talk with your health care provider if you think that you are experiencing depression. What should I know about immunizations? It is important that you get and maintain your immunizations. These include:  Tetanus, diphtheria, and pertussis (Tdap) booster vaccine.  Influenza every year before the flu season begins.  Pneumonia vaccine.  Shingles vaccine.  Your health care provider may also recommend other immunizations. This information is not intended to replace advice given to you by your health care provider. Make sure you discuss any questions you have with your health care provider. Document Released: 10/14/2005  Document Revised: 03/11/2016 Document Reviewed: 05/26/2015 Elsevier Interactive Patient Education  2018 [employer]  

## 2018-01-01 NOTE — Progress Notes (Addendum)
Patient: Kathleen Glenn, Female    DOB: 1946-06-04, 72 y.o.   MRN: [id number] Visit Date: 01/01/2018  Today's Provider: Mar Daring, PA-C   Chief Complaint  Patient presents with  . Annual Exam   Subjective:   Kathleen Glenn is a 72 y.o. female who presents today for health maintenance.. She feels fairly well. She reports exercising as tolerated.  She reports she is sleeping well.  Patient does have a history of endometrial cancer which she underwent a hysterectomy. 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  ----------------------------------------------------------------   Review of Systems  HENT: Positive for tinnitus.   Eyes: Negative.   Respiratory: Negative.  Negative for shortness of breath.   Cardiovascular: Negative.   Gastrointestinal: Positive for constipation.  Endocrine: Negative.   Genitourinary: Negative.   Musculoskeletal: Positive for arthralgias.  Allergic/Immunologic: Negative.   Neurological: Negative.   Hematological: Negative.   Psychiatric/Behavioral: Negative.     Social History      She  reports that she has never smoked. She has never used smokeless tobacco. She reports that she drinks alcohol. She reports that she does not use drugs.       Social History   Socioeconomic History  . Marital status: Married    Spouse name: Not on file  . Number of children: 2  . Years of education: Not on file  . Highest education level: Some college, no degree  Occupational History  . Not on file  Social Needs  . Financial resource strain: Not hard at all  . Food insecurity:    Worry: Never true    Inability: Never true  . Transportation needs:    Medical: No    Non-medical: No  Tobacco Use  . Smoking status: Never Smoker  . Smokeless tobacco: Never Used  Substance and Sexual Activity  . Alcohol use: Yes    Comment: occasional glass of wine  . Drug use: No  .  Sexual activity: Yes    Birth control/protection: Surgical  Lifestyle  . Physical activity:    Days per week: Not on file    Minutes per session: Not on file  . Stress: Not at all  Relationships  . Social connections:    Talks on phone: Not on file    Gets together: Not on file    Attends religious service: Not on file    Active member of club or organization: Not on file    Attends meetings of clubs or organizations: Not on file    Relationship status: Not on file  Other Topics Concern  . Not on file  Social History Narrative  . Not on file    Past Medical History:  Diagnosis Date  . Endometrial cancer ([place]) 2013  . Hypertension      Patient Active Problem List   Diagnosis Date Noted  . Colo-vesical fistula 10/07/2015  . Rectovaginal fistula 07/14/2015  . Vaginal anomaly 07/09/2015  . Acquired deformity of finger 12/25/2014  . Primary cancer of endometrium ([place]) 12/25/2014  . Blood glucose elevated 12/25/2014  . Adult BMI 30+ 12/25/2014  . Hemorrhage, postmenopausal 12/25/2014  . Basal cell papilloma 12/25/2014  . Leg varices 12/25/2014  . Essential hypertension 05/03/2013  . Abnormal EKG 05/03/2013  . Malignant neoplasm of corpus uteri, except isthmus ([place]) 03/29/2012    Past Surgical History:  Procedure Laterality Date  . ABDOMINAL HYSTERECTOMY  2013   BSO,  Tumor Stage 1 Cancer  . BILATERAL SALPINGOOPHORECTOMY  2013  . CATARACT EXTRACTION  2005  . CERVICAL POLYPECTOMY  08/2011   Dr. Avelina Laine  . ENDOMETRIAL BIOPSY  08/15/2011   Dr. Avelina Laine;   . HYSTEROSCOPY  08/2011   Dr. Avelina Laine    Family History        Family Status  Relation Name Status  . Father  Deceased  . Sister  Alive  . Brother  Alive  . Mother  Deceased  . Sister  Alive  . Neg Hx  (Not Specified)        Her family history includes Arthritis in her father; Congestive Heart Failure in her father; Diabetes in her father and sister; Hypertension in her father; Skin cancer in  her brother, sister, and sister; Throat cancer in her father; Thyroid nodules in her sister; Transient ischemic attack in her father and mother. There is no history of Breast cancer.      Allergies  Allergen Reactions  . Voltaren [Diclofenac Sodium] Swelling    Eye drops     Current Outpatient Medications:  .  Biotin 1 MG CAPS, Take by mouth daily. , Disp: , Rfl:  .  Calcium Carbonate-Vitamin D3 (CALCIUM 600/VITAMIN D) 600-400 MG-UNIT TABS, Take 1 tablet by mouth daily., Disp: , Rfl:  .  Multiple Vitamin (MULTIVITAMIN) capsule, Take by mouth., Disp: , Rfl:  .  Omega-3 Fatty Acids (FISH OIL) 1200 MG CAPS, Take 1 capsule by mouth daily., Disp: , Rfl:  .  RA TURMERIC PO, Take by mouth daily., Disp: , Rfl:  .  triamterene-hydrochlorothiazide (MAXZIDE-25) 37.5-25 MG tablet, TAKE 1 TABLET BY MOUTH DAILY., Disp: 90 tablet, Rfl: 3   Patient Care Team: Mar Daring, PA-C as PCP - General (Family Medicine) Brendolyn Patty, MD as Consulting Physician (Dermatology)      Objective:   Vitals: BP (!) 158/84 (BP Location: Left Arm, Patient Position: Sitting, Cuff Size: Large)   Pulse 97   Ht 5\' 3"  (1.6 m) Comment: per patient  Wt 208 lb (94.3 kg)   SpO2 95%   BMI 36.85 kg/m    Vitals:   01/01/18 1359  BP: (!) 158/84  Pulse: 97  SpO2: 95%  Weight: 208 lb (94.3 kg)  Height: 5\' 3"  (1.6 m)     Physical Exam  Constitutional: She is oriented to person, place, and time. She appears well-developed and well-nourished. No distress.  HENT:  Head: Normocephalic and atraumatic.  Right Ear: Hearing, tympanic membrane, external ear and ear canal normal.  Left Ear: Hearing, tympanic membrane, external ear and ear canal normal.  Nose: Nose normal.  Mouth/Throat: Uvula is midline, oropharynx is clear and moist and mucous membranes are normal. No oropharyngeal exudate.  Eyes: Pupils are equal, round, and reactive to light. Conjunctivae and EOM are normal. Right eye exhibits no discharge. Left  eye exhibits no discharge. No scleral icterus.  Neck: Normal range of motion. Neck supple. No JVD present. Carotid bruit is not present. No tracheal deviation present. No thyromegaly present.  Cardiovascular: Normal rate, regular rhythm, normal heart sounds and intact distal pulses. Exam reveals no gallop and no friction rub.  No murmur heard. Pulmonary/Chest: Effort normal and breath sounds normal. No respiratory distress. She has no wheezes. She has no rales. She exhibits no tenderness. Right breast exhibits no inverted nipple, no mass, no nipple discharge, no skin change and no tenderness. Left breast exhibits no inverted nipple, no mass, no nipple discharge, no skin change and no  tenderness. No breast tenderness, discharge or bleeding. Breasts are symmetrical.  Abdominal: Soft. Bowel sounds are normal. She exhibits no distension and no mass. There is no tenderness. There is no rebound and no guarding. Hernia confirmed negative in the right inguinal area and confirmed negative in the left inguinal area.  Genitourinary: Rectum normal and vagina normal. Pelvic exam was performed with patient supine. There is no rash, tenderness, lesion or injury on the right labia. There is no rash, tenderness, lesion or injury on the left labia. No erythema, tenderness or bleeding in the vagina. No signs of injury around the vagina. No vaginal discharge found.  Genitourinary Comments: Uterus and cervix surgically absent  Musculoskeletal: Normal range of motion. She exhibits no edema or tenderness.  Lymphadenopathy:    She has no cervical adenopathy.       Right: No inguinal adenopathy present.       Left: No inguinal adenopathy present.  Neurological: She is alert and oriented to person, place, and time. She has normal reflexes. No cranial nerve deficit. Coordination normal.  Skin: Skin is warm and dry. No rash noted. She is not diaphoretic.  Psychiatric: She has a normal mood and affect. Her behavior is normal.  Judgment and thought content normal.  Vitals reviewed.    Depression Screen PHQ 2/9 Scores 12/28/2017 10/25/2016 06/14/2016 02/04/2015  PHQ - 2 Score 0 0 0 0      Assessment & Plan:     Routine Health Maintenance and Physical Exam  Exercise Activities and Dietary recommendations Goals    . DIET - EAT MORE FRUITS AND VEGETABLES     Recommend to continue increasing fruits and vegetables in daily diet (2 servings each a day).       Immunization History  Administered Date(s) Administered  . Influenza Split 09/29/2011  . Pneumococcal Conjugate-13 06/25/2014  . Pneumococcal Polysaccharide-23 02/17/2016  . Td 09/29/2011  . Tdap 09/29/2011    Health Maintenance  Topic Date Due  . INFLUENZA VACCINE  04/05/2018  . MAMMOGRAM  12/14/2018  . Fecal DNA (Cologuard)  01/27/2020  . TETANUS/TDAP  09/28/2021  . DEXA SCAN  Completed  . Hepatitis C Screening  Completed  . PNA vac Low Risk Adult  Completed     Discussed health benefits of physical activity, and encouraged her to engage in regular exercise appropriate for her age and condition.    Normal physical exam today. Will check labs as below and f/u pending lab results. If labs are stable and WNL she will not need to have these rechecked for one year at her next annual physical exam. She is to call the office in the meantime if she has any acute issue, questions or concerns.  1. Breast cancer screening Normal mammogram last year. Breast exam normal. Due for mammogram in 2020.  2. Screening for vaginal cancer Pap collected today. Will send as below and f/u pending results. - Pap IG w/ reflex to HPV when ASC-U  3. Malignant neoplasm of corpus uteri, except isthmus ([facility]) Normal exam today. Pap collected.  - Pap IG w/ reflex to HPV when ASC-U  4. Rectovaginal fistula No recurrence of symptoms. - Pap IG w/ reflex to HPV when ASC-U  5. Colo-vesical fistula No recurrence of symptoms.  6. Essential hypertension Stable on  Maxzide 37.5-25mg . . Will check labs as below and f/u pending results. - CBC w/Diff/Platelet - Comprehensive Metabolic Panel (CMET) - Lipid Profile - HgB A1c  7. BMI 36.0-36.9,adult Counseled patient on healthy lifestyle  modifications including dieting and exercise.  - CBC w/Diff/Platelet - Comprehensive Metabolic Panel (CMET) - Lipid Profile - HgB A1c  8. Mixed hyperlipidemia Diet controlled. Will check labs as below and f/u pending results. - CBC w/Diff/Platelet - Comprehensive Metabolic Panel (CMET) - Lipid Profile - HgB A1c  9. Elevated hemoglobin A1c Diet controlled. Will check labs as below and f/u pending results. - CBC w/Diff/Platelet - Comprehensive Metabolic Panel (CMET) - Lipid Profile - HgB A1c  --------------------------------------------------------------------    Mar Daring, PA-C  [facility]

## 2018-01-02 ENCOUNTER — Telehealth: Payer: Self-pay

## 2018-01-02 LAB — LIPID PANEL
CHOL/HDL RATIO: 3.2 ratio (ref 0.0–4.4)
Cholesterol, Total: 164 mg/dL (ref 100–199)
HDL: 51 mg/dL (ref >39)
LDL Calculated: 99 mg/dL (ref 0–99)
Triglycerides: 71 mg/dL (ref 0–149)
VLDL CHOLESTEROL CAL: 14 mg/dL (ref 5–40)

## 2018-01-02 LAB — CBC WITH DIFFERENTIAL/PLATELET
BASOS: 0 %
Basophils Absolute: 0 10*3/uL (ref 0.0–0.2)
EOS (ABSOLUTE): 0.1 10*3/uL (ref 0.0–0.4)
Eos: 1 %
HEMOGLOBIN: 13.1 g/dL (ref 11.1–15.9)
Hematocrit: 38.3 % (ref 34.0–46.6)
IMMATURE GRANS (ABS): 0 10*3/uL (ref 0.0–0.1)
Immature Granulocytes: 0 %
LYMPHS: 14 %
Lymphocytes Absolute: 1.2 10*3/uL (ref 0.7–3.1)
MCH: 31.7 pg (ref 26.6–33.0)
MCHC: 34.2 g/dL (ref 31.5–35.7)
MCV: 93 fL (ref 79–97)
MONOCYTES: 7 %
Monocytes Absolute: 0.6 10*3/uL (ref 0.1–0.9)
NEUTROS ABS: 6.7 10*3/uL (ref 1.4–7.0)
Neutrophils: 78 %
Platelets: 324 10*3/uL (ref 150–379)
RBC: 4.13 x10E6/uL (ref 3.77–5.28)
RDW: 13.9 % (ref 12.3–15.4)
WBC: 8.7 10*3/uL (ref 3.4–10.8)

## 2018-01-02 LAB — COMPREHENSIVE METABOLIC PANEL
A/G RATIO: 1.4 (ref 1.2–2.2)
ALBUMIN: 4.1 g/dL (ref 3.5–4.8)
ALT: 10 IU/L (ref 0–32)
AST: 17 IU/L (ref 0–40)
Alkaline Phosphatase: 61 IU/L (ref 39–117)
BILIRUBIN TOTAL: 0.3 mg/dL (ref 0.0–1.2)
BUN / CREAT RATIO: 21 (ref 12–28)
BUN: 19 mg/dL (ref 8–27)
CHLORIDE: 99 mmol/L (ref 96–106)
CO2: 22 mmol/L (ref 20–29)
Calcium: 9.2 mg/dL (ref 8.7–10.3)
Creatinine, Ser: 0.9 mg/dL (ref 0.57–1.00)
GFR, EST AFRICAN AMERICAN: 75 mL/min/{1.73_m2} (ref >59)
GFR, EST NON AFRICAN AMERICAN: 65 mL/min/{1.73_m2} (ref >59)
Globulin, Total: 2.9 g/dL (ref 1.5–4.5)
Glucose: 95 mg/dL (ref 65–99)
POTASSIUM: 3.7 mmol/L (ref 3.5–5.2)
Sodium: 137 mmol/L (ref 134–144)
TOTAL PROTEIN: 7 g/dL (ref 6.0–8.5)

## 2018-01-02 LAB — PAP IG W/ RFLX HPV ASCU: PAP Smear Comment: 0

## 2018-01-02 LAB — HEMOGLOBIN A1C
ESTIMATED AVERAGE GLUCOSE: 126 mg/dL
Hgb A1c MFr Bld: 6 % — ABNORMAL HIGH (ref 4.8–5.6)

## 2018-01-02 NOTE — Telephone Encounter (Signed)
Patient advised as below.  

## 2018-01-02 NOTE — Telephone Encounter (Signed)
-----   Message from Mar Daring, PA-C sent at 01/02/2018  9:13 AM EDT ----- A1c stable at 6.0. Cholesterol normal. Kidney and liver function normal. Blood count normal.

## 2018-01-03 ENCOUNTER — Telehealth: Payer: Self-pay

## 2018-01-03 NOTE — Telephone Encounter (Signed)
-----   Message from Mar Daring, PA-C sent at 01/03/2018  8:21 AM EDT ----- Pap is normal.  Will repeat in 3 years. (history of uterine cancer) Does require annual pelvic exams due to h/o fistulas

## 2018-01-03 NOTE — Telephone Encounter (Signed)
Patient advised as below. Patient verbalizes understanding and is in agreement with treatment plan.  

## 2018-01-26 NOTE — Addendum Note (Signed)
Addended by: Mar Daring on: 01/26/2018 02:13 PM   Modules accepted: Level of Service

## 2018-05-22 DIAGNOSIS — L738 Other specified follicular disorders: Secondary | ICD-10-CM | POA: Diagnosis not present

## 2018-05-22 DIAGNOSIS — D1800 Hemangioma unspecified site: Secondary | ICD-10-CM | POA: Diagnosis not present

## 2018-05-22 DIAGNOSIS — L720 Epidermal cyst: Secondary | ICD-10-CM | POA: Diagnosis not present

## 2018-05-22 DIAGNOSIS — Z1283 Encounter for screening for malignant neoplasm of skin: Secondary | ICD-10-CM | POA: Diagnosis not present

## 2018-05-22 DIAGNOSIS — D2260 Melanocytic nevi of unspecified upper limb, including shoulder: Secondary | ICD-10-CM | POA: Diagnosis not present

## 2018-05-22 DIAGNOSIS — L578 Other skin changes due to chronic exposure to nonionizing radiation: Secondary | ICD-10-CM | POA: Diagnosis not present

## 2018-05-22 DIAGNOSIS — Z85828 Personal history of other malignant neoplasm of skin: Secondary | ICD-10-CM | POA: Diagnosis not present

## 2018-05-22 DIAGNOSIS — L821 Other seborrheic keratosis: Secondary | ICD-10-CM | POA: Diagnosis not present

## 2018-07-20 DIAGNOSIS — H353122 Nonexudative age-related macular degeneration, left eye, intermediate dry stage: Secondary | ICD-10-CM | POA: Diagnosis not present

## 2018-07-20 DIAGNOSIS — H353211 Exudative age-related macular degeneration, right eye, with active choroidal neovascularization: Secondary | ICD-10-CM | POA: Diagnosis not present

## 2018-08-20 DIAGNOSIS — H353211 Exudative age-related macular degeneration, right eye, with active choroidal neovascularization: Secondary | ICD-10-CM | POA: Diagnosis not present

## 2018-08-20 DIAGNOSIS — H353122 Nonexudative age-related macular degeneration, left eye, intermediate dry stage: Secondary | ICD-10-CM | POA: Diagnosis not present

## 2018-09-06 DIAGNOSIS — H353211 Exudative age-related macular degeneration, right eye, with active choroidal neovascularization: Secondary | ICD-10-CM | POA: Diagnosis not present

## 2018-09-06 DIAGNOSIS — H3561 Retinal hemorrhage, right eye: Secondary | ICD-10-CM | POA: Diagnosis not present

## 2018-09-06 DIAGNOSIS — H353121 Nonexudative age-related macular degeneration, left eye, early dry stage: Secondary | ICD-10-CM | POA: Diagnosis not present

## 2018-10-01 DIAGNOSIS — H353211 Exudative age-related macular degeneration, right eye, with active choroidal neovascularization: Secondary | ICD-10-CM | POA: Diagnosis not present

## 2018-11-06 DIAGNOSIS — H353211 Exudative age-related macular degeneration, right eye, with active choroidal neovascularization: Secondary | ICD-10-CM | POA: Diagnosis not present

## 2018-11-20 ENCOUNTER — Other Ambulatory Visit: Payer: Self-pay | Admitting: Physician Assistant

## 2018-11-20 DIAGNOSIS — I10 Essential (primary) hypertension: Secondary | ICD-10-CM

## 2018-12-19 DIAGNOSIS — H353211 Exudative age-related macular degeneration, right eye, with active choroidal neovascularization: Secondary | ICD-10-CM | POA: Diagnosis not present

## 2019-01-30 DIAGNOSIS — H353211 Exudative age-related macular degeneration, right eye, with active choroidal neovascularization: Secondary | ICD-10-CM | POA: Diagnosis not present

## 2019-02-12 ENCOUNTER — Other Ambulatory Visit: Payer: Self-pay | Admitting: Physician Assistant

## 2019-02-12 DIAGNOSIS — I10 Essential (primary) hypertension: Secondary | ICD-10-CM

## 2019-02-18 DIAGNOSIS — H0012 Chalazion right lower eyelid: Secondary | ICD-10-CM | POA: Diagnosis not present

## 2019-03-27 DIAGNOSIS — H353211 Exudative age-related macular degeneration, right eye, with active choroidal neovascularization: Secondary | ICD-10-CM | POA: Diagnosis not present

## 2019-04-08 ENCOUNTER — Other Ambulatory Visit: Payer: Self-pay

## 2019-05-08 DIAGNOSIS — H353211 Exudative age-related macular degeneration, right eye, with active choroidal neovascularization: Secondary | ICD-10-CM | POA: Diagnosis not present

## 2019-05-09 ENCOUNTER — Other Ambulatory Visit: Payer: Self-pay | Admitting: Physician Assistant

## 2019-05-09 DIAGNOSIS — I10 Essential (primary) hypertension: Secondary | ICD-10-CM

## 2019-06-18 DIAGNOSIS — R220 Localized swelling, mass and lump, head: Secondary | ICD-10-CM | POA: Diagnosis not present

## 2019-06-18 DIAGNOSIS — Z85828 Personal history of other malignant neoplasm of skin: Secondary | ICD-10-CM | POA: Diagnosis not present

## 2019-06-18 DIAGNOSIS — L649 Androgenic alopecia, unspecified: Secondary | ICD-10-CM | POA: Diagnosis not present

## 2019-06-18 DIAGNOSIS — L732 Hidradenitis suppurativa: Secondary | ICD-10-CM | POA: Diagnosis not present

## 2019-06-18 DIAGNOSIS — D1800 Hemangioma unspecified site: Secondary | ICD-10-CM | POA: Diagnosis not present

## 2019-06-18 DIAGNOSIS — L814 Other melanin hyperpigmentation: Secondary | ICD-10-CM | POA: Diagnosis not present

## 2019-06-18 DIAGNOSIS — L820 Inflamed seborrheic keratosis: Secondary | ICD-10-CM | POA: Diagnosis not present

## 2019-06-18 DIAGNOSIS — L821 Other seborrheic keratosis: Secondary | ICD-10-CM | POA: Diagnosis not present

## 2019-06-19 DIAGNOSIS — H353211 Exudative age-related macular degeneration, right eye, with active choroidal neovascularization: Secondary | ICD-10-CM | POA: Diagnosis not present

## 2019-08-05 DIAGNOSIS — H353211 Exudative age-related macular degeneration, right eye, with active choroidal neovascularization: Secondary | ICD-10-CM | POA: Diagnosis not present

## 2019-08-07 ENCOUNTER — Other Ambulatory Visit: Payer: Self-pay | Admitting: Physician Assistant

## 2019-08-07 DIAGNOSIS — I10 Essential (primary) hypertension: Secondary | ICD-10-CM

## 2019-09-20 ENCOUNTER — Ambulatory Visit (INDEPENDENT_AMBULATORY_CARE_PROVIDER_SITE_OTHER): Payer: Medicare Other | Admitting: Physician Assistant

## 2019-09-20 ENCOUNTER — Encounter: Payer: Self-pay | Admitting: Physician Assistant

## 2019-09-20 DIAGNOSIS — I10 Essential (primary) hypertension: Secondary | ICD-10-CM | POA: Diagnosis not present

## 2019-09-20 MED ORDER — TRIAMTERENE-HCTZ 37.5-25 MG PO TABS: 1.0000 | ORAL_TABLET | Freq: Every day | ORAL | 3 refills | Status: DC

## 2019-09-20 NOTE — Progress Notes (Signed)
Patient: Kathleen Glenn Female    DOB: [birth year]   74 y.o.   MRN: [id number] Visit Date: 09/20/2019  Today's Provider: Mar Daring, PA-C   Chief Complaint  Patient presents with  . Medication Refill   Subjective:    Virtual Visit via Telephone Note  I connected with Twanna Hy on 09/20/19 at  4:20 PM EST by telephone and verified that I am speaking with the correct person using two identifiers.  Location: Patient: Home Provider: [facility]   I discussed the limitations, risks, security and privacy concerns of performing an evaluation and management service by telephone and the availability of in person appointments. I also discussed with the patient that there may be a patient responsible charge related to this service. The patient expressed understanding and agreed to proceed.   The patient is needing a refill on her triamterene-hydrochlorothiazide (MAXZIDE-25) 37.5-25 MG tablet, TAKE 1 TABLET BY MOUTH EVERY DAY, Disp: 90 tablet, Rfl: 0 Hypertension This is a chronic problem. The problem is controlled. Pertinent negatives include no anxiety, blurred vision, chest pain, headaches, malaise/fatigue, palpitations, peripheral edema or shortness of breath. There are no associated agents to hypertension. The current treatment provides moderate improvement. There are no compliance problems.   Home BP readings are normally low 130s/80s.  Allergies  Allergen Reactions  . Diclofenac Sodium Swelling    Eye drops Eye drops Eye drops     Current Outpatient Medications:  .  Biotin 1 MG CAPS, Take by mouth daily. , Disp: , Rfl:  .  Calcium Carbonate-Vit D-Min (CALCIUM 600+D PLUS MINERALS) 600-400 MG-UNIT TABS, Take by mouth., Disp: , Rfl:  .  Calcium Carbonate-Vitamin D3 (CALCIUM 600/VITAMIN D) 600-400 MG-UNIT TABS, Take 1 tablet by mouth daily., Disp: , Rfl:  .  Dermatological Products, Misc. (GENADUR) KIT, Take by mouth., Disp: , Rfl:  .  Multiple Vitamin (MULTIVITAMIN)  capsule, Take by mouth., Disp: , Rfl:  .  Omega-3 Fatty Acids (FISH OIL) 1200 MG CAPS, Take 1 capsule by mouth daily., Disp: , Rfl:  .  RA TURMERIC PO, Take by mouth daily., Disp: , Rfl:  .  tobramycin-dexamethasone (TOBRADEX) ophthalmic solution, INSTILL 1 DROP(S) IN RIGHT EYE 3 TIMES A DAY FOR 14 DAYS, Disp: , Rfl:  .  triamterene-hydrochlorothiazide (MAXZIDE-25) 37.5-25 MG tablet, TAKE 1 TABLET BY MOUTH EVERY DAY, Disp: 90 tablet, Rfl: 0  Review of Systems  Constitutional: Negative.  Negative for malaise/fatigue.  HENT: Negative.   Eyes: Negative.  Negative for blurred vision.  Respiratory: Negative.  Negative for shortness of breath.   Cardiovascular: Negative.  Negative for chest pain and palpitations.  Gastrointestinal: Negative.   Endocrine: Negative.   Genitourinary: Negative.   Musculoskeletal: Negative.   Skin: Negative.   Allergic/Immunologic: Negative.   Neurological: Negative.  Negative for headaches.  Hematological: Negative.   Psychiatric/Behavioral: Negative.     Social History   Tobacco Use  . Smoking status: Never Smoker  . Smokeless tobacco: Never Used  Substance Use Topics  . Alcohol use: Yes    Comment: occasional glass of wine      Objective:   Ht 5' 3"  (1.6 m)   Wt 208 lb (94.3 kg)   BMI 36.85 kg/m  Vitals:   09/20/19 1617  Weight: 208 lb (94.3 kg)  Height: 5' 3"  (1.6 m)  Body mass index is 36.85 kg/m.   Physical Exam Vitals reviewed.  Constitutional:      General: She is not in acute  distress. Cardiovascular:     Heart sounds: No murmur.  Neurological:     Mental Status: She is alert.      No results found for any visits on 09/20/19.     Assessment & Plan     1. Essential hypertension Stable. Diagnosis pulled for medication refill. Continue current medical treatment plan. - triamterene-hydrochlorothiazide (MAXZIDE-25) 37.5-25 MG tablet; Take 1 tablet by mouth daily.  Dispense: 90 tablet; Refill: 3   I discussed the  assessment and treatment plan with the patient. The patient was provided an opportunity to ask questions and all were answered. The patient agreed with the plan and demonstrated an understanding of the instructions.   The patient was advised to call back or seek an in-person evaluation if the symptoms worsen or if the condition fails to improve as anticipated.  I provided 15 minutes of non-face-to-face time during this encounter.    Mar Daring, PA-C  [facility]

## 2019-10-04 DIAGNOSIS — H353211 Exudative age-related macular degeneration, right eye, with active choroidal neovascularization: Secondary | ICD-10-CM | POA: Diagnosis not present

## 2019-11-04 DIAGNOSIS — H353211 Exudative age-related macular degeneration, right eye, with active choroidal neovascularization: Secondary | ICD-10-CM | POA: Diagnosis not present

## 2019-12-23 DIAGNOSIS — H353211 Exudative age-related macular degeneration, right eye, with active choroidal neovascularization: Secondary | ICD-10-CM | POA: Diagnosis not present

## 2020-01-23 DIAGNOSIS — H353211 Exudative age-related macular degeneration, right eye, with active choroidal neovascularization: Secondary | ICD-10-CM | POA: Diagnosis not present

## 2020-04-02 DIAGNOSIS — H353211 Exudative age-related macular degeneration, right eye, with active choroidal neovascularization: Secondary | ICD-10-CM | POA: Diagnosis not present

## 2020-05-08 DIAGNOSIS — H353211 Exudative age-related macular degeneration, right eye, with active choroidal neovascularization: Secondary | ICD-10-CM | POA: Diagnosis not present

## 2020-06-12 DIAGNOSIS — H353211 Exudative age-related macular degeneration, right eye, with active choroidal neovascularization: Secondary | ICD-10-CM | POA: Diagnosis not present

## 2020-06-23 ENCOUNTER — Ambulatory Visit (INDEPENDENT_AMBULATORY_CARE_PROVIDER_SITE_OTHER): Payer: Medicare Other | Admitting: Dermatology

## 2020-06-23 ENCOUNTER — Other Ambulatory Visit: Payer: Self-pay

## 2020-06-23 DIAGNOSIS — Z85828 Personal history of other malignant neoplasm of skin: Secondary | ICD-10-CM | POA: Diagnosis not present

## 2020-06-23 DIAGNOSIS — Z1283 Encounter for screening for malignant neoplasm of skin: Secondary | ICD-10-CM | POA: Diagnosis not present

## 2020-06-23 DIAGNOSIS — D485 Neoplasm of uncertain behavior of skin: Secondary | ICD-10-CM | POA: Diagnosis not present

## 2020-06-23 DIAGNOSIS — L820 Inflamed seborrheic keratosis: Secondary | ICD-10-CM | POA: Diagnosis not present

## 2020-06-23 DIAGNOSIS — L814 Other melanin hyperpigmentation: Secondary | ICD-10-CM | POA: Diagnosis not present

## 2020-06-23 DIAGNOSIS — D229 Melanocytic nevi, unspecified: Secondary | ICD-10-CM | POA: Diagnosis not present

## 2020-06-23 DIAGNOSIS — L821 Other seborrheic keratosis: Secondary | ICD-10-CM | POA: Diagnosis not present

## 2020-06-23 DIAGNOSIS — M898X9 Other specified disorders of bone, unspecified site: Secondary | ICD-10-CM | POA: Diagnosis not present

## 2020-06-23 DIAGNOSIS — D1800 Hemangioma unspecified site: Secondary | ICD-10-CM | POA: Diagnosis not present

## 2020-06-23 DIAGNOSIS — L578 Other skin changes due to chronic exposure to nonionizing radiation: Secondary | ICD-10-CM | POA: Diagnosis not present

## 2020-06-23 DIAGNOSIS — I8310 Varicose veins of unspecified lower extremity with inflammation: Secondary | ICD-10-CM | POA: Diagnosis not present

## 2020-06-23 DIAGNOSIS — D2262 Melanocytic nevi of left upper limb, including shoulder: Secondary | ICD-10-CM | POA: Diagnosis not present

## 2020-06-23 HISTORY — DX: Melanocytic nevi, unspecified: D22.9

## 2020-06-23 NOTE — Patient Instructions (Signed)

## 2020-06-23 NOTE — Progress Notes (Signed)
Follow-Up Visit   Subjective  Kathleen Glenn is a 74 y.o. female who presents for the following: Annual Exam (Hx BCCs - left medial breast, right chin).   The following portions of the chart were reviewed this encounter and updated as appropriate:      Review of Systems:  No other skin or systemic complaints except as noted in HPI or Assessment and Plan.  Objective  Well appearing patient in no apparent distress; mood and affect are within normal limits.  A full examination was performed including scalp, head, eyes, ears, nose, lips, neck, chest, axillae, abdomen, back, buttocks, bilateral upper extremities, bilateral lower extremities, hands, feet, fingers, toes, fingernails, and toenails. All findings within normal limits unless otherwise noted below.  Objective  Sup Forehead, Inf forehead: Firm nodules, sup forehead 0.6 cm and inf forehead 0.8 cm.  Objective  Left Posterior Arm Above Elbow: 6.0 mm brown macule with irregular pigment       Objective  Right Lower Leg - Posterior: 1.5 cm erythematous keratotic or waxy stuck-on plaque.    Assessment & Plan   Skin cancer screening performed today.  Actinic Damage - diffuse scaly erythematous macules with underlying dyspigmentation - Recommend daily broad spectrum sunscreen SPF 30+ to sun-exposed areas, reapply every 2 hours as needed.  - Call for new or changing lesions.  Seborrheic Keratoses - Stuck-on, waxy, tan-brown papules and plaques  - Discussed benign etiology and prognosis. - Observe - Call for any changes  Hemangiomas - Red papules - Discussed benign nature - Observe - Call for any changes Lentigines - Scattered tan macules - Discussed due to sun exposure - Benign, observe - Call for any changes  History of Basal Cell Carcinoma of the Skin - No evidence of recurrence today on right chin and left med breast - Recommend regular full body skin exams - Recommend daily broad spectrum sunscreen  SPF 30+ to sun-exposed areas, reapply every 2 hours as needed.  - Call if any new or changing lesions are noted between office visits  Melanocytic Nevi - Tan-brown and/or pink-flesh-colored symmetric macules and papules - Benign appearing on exam today - Observation - Call clinic for new or changing moles - Recommend daily use of broad spectrum spf 30+ sunscreen to sun-exposed areas.   Varicose Veins - Dilated blue, purple or red veins at the lower extremities - Reassured - Smaller veins can be treated by sclerotherapy (a procedure to inject a medicine into the veins to make them disappear) if desired, but the treatment is not covered by insurance  Bony prominence Sup Forehead, Inf forehead  Stable. No change from previous office note. Benign, observe.    Neoplasm of uncertain behavior of skin Left Posterior Arm Above Elbow  Epidermal / dermal shaving  Lesion diameter (cm):  0.6 Informed consent: discussed and consent obtained   Patient was prepped and draped in usual sterile fashion: Area prepped with alcohol. Anesthesia: the lesion was anesthetized in a standard fashion   Anesthetic:  1% lidocaine w/ epinephrine 1-100,000 buffered w/ 8.4% NaHCO3 Instrument used: flexible razor blade   Hemostasis achieved with: pressure, aluminum chloride and electrodesiccation   Outcome: patient tolerated procedure well   Post-procedure details: wound care instructions given   Post-procedure details comment:  Ointment and small bandage applied Additional details:  Post tx defect 8.0 x 6.0 mm  Specimen 1 - Surgical pathology Differential Diagnosis: Nevus r/o Dysplasia Check Margins: Yes 6.0 mm brown macule with irregular pigment  Inflamed seborrheic keratosis  Right Lower Leg - Posterior  Not bothersome to patient Reassured benign age-related growth.  Recommend observation.  Discussed cryotherapy if spot(s) become symptomatic.  Eucerin Roughness Relief Spot treatment - lotion sample  given   Return in about 1 year (around 06/23/2021) for TBSE.   IJamesetta Orleans, CMA, am acting as scribe for Brendolyn Patty, MD .  Documentation: I have reviewed the above documentation for accuracy and completeness, and I agree with the above.  Brendolyn Patty MD

## 2020-06-29 ENCOUNTER — Telehealth: Payer: Self-pay

## 2020-06-29 NOTE — Telephone Encounter (Signed)
-----   Message from Brendolyn Patty, MD sent at 06/29/2020  9:48 AM EDT ----- Skin , left posterior arm above elbow ATYPICAL JUNCTIONAL LENTIGINOUS MELANOCYTIC PROLIFERATION OVERLYING MELANOCYTIC NEVUS, INTRADERMAL TYPE, CLOSE TO MARGIN  Atypical mole- needs excision

## 2020-06-29 NOTE — Telephone Encounter (Signed)
Informed pt of results and scheduled excision.  °

## 2020-06-29 NOTE — Telephone Encounter (Signed)
Lft pt message to call for bx result/sh 

## 2020-07-14 ENCOUNTER — Telehealth: Payer: Self-pay | Admitting: Physician Assistant

## 2020-07-14 NOTE — Telephone Encounter (Signed)
Copied from [place] [id number]. Topic: Medicare AWV >> Jul 14, 2020 11:33 AM Cher Nakai R wrote: Reason for CRM: Left message for patient to call back and schedule Medicare Annual Wellness Visit (AWV) either virtually or in office.  Last AWV  12/28/2017  Please schedule at anytime with [facility] Health Advisor.  If any questions, please contact me at [phone]

## 2020-08-03 DIAGNOSIS — H353122 Nonexudative age-related macular degeneration, left eye, intermediate dry stage: Secondary | ICD-10-CM | POA: Diagnosis not present

## 2020-08-17 ENCOUNTER — Other Ambulatory Visit: Payer: Self-pay

## 2020-08-17 ENCOUNTER — Encounter: Payer: Self-pay | Admitting: Dermatology

## 2020-08-17 ENCOUNTER — Ambulatory Visit (INDEPENDENT_AMBULATORY_CARE_PROVIDER_SITE_OTHER): Payer: Medicare Other | Admitting: Dermatology

## 2020-08-17 DIAGNOSIS — D229 Melanocytic nevi, unspecified: Secondary | ICD-10-CM

## 2020-08-17 DIAGNOSIS — D485 Neoplasm of uncertain behavior of skin: Secondary | ICD-10-CM

## 2020-08-17 DIAGNOSIS — D2262 Melanocytic nevi of left upper limb, including shoulder: Secondary | ICD-10-CM

## 2020-08-17 DIAGNOSIS — L988 Other specified disorders of the skin and subcutaneous tissue: Secondary | ICD-10-CM | POA: Diagnosis not present

## 2020-08-17 NOTE — Progress Notes (Signed)
   Follow-Up Visit   Subjective  Kathleen Glenn is a 74 y.o. female who presents for the following: Atypical nevus bx proven (L posterior arm above elbow, pt presents for excison).   The following portions of the chart were reviewed this encounter and updated as appropriate:       Review of Systems:  No other skin or systemic complaints except as noted in HPI or Assessment and Plan.  Objective  Well appearing patient in no apparent distress; mood and affect are within normal limits.  A focused examination was performed including L arm. Relevant physical exam findings are noted in the Assessment and Plan.  Objective  L posterior arm above elbow: Pink bx site 0.7cm   Assessment & Plan  Atypical nevus L posterior arm above elbow  Skin excision - L posterior arm above elbow  Lesion length (cm):  0.7 Lesion width (cm):  0.7 Margin per side (cm):  0.2 Total excision diameter (cm):  1.1 Informed consent: discussed and consent obtained   Timeout: patient name, date of birth, surgical site, and procedure verified   Procedure prep:  Patient was prepped and draped in usual sterile fashion Prep type:  Povidone-iodine Anesthesia: the lesion was anesthetized in a standard fashion   Anesthetic:  1% lidocaine w/ epinephrine 1-100,000 buffered w/ 8.4% NaHCO3 (16) Instrument used: #15 blade   Hemostasis achieved with: pressure and electrodesiccation   Outcome: patient tolerated procedure well with no complications   Additional details:  Tag at anterior 9:00 tip  Skin repair - L posterior arm above elbow Complexity:  Intermediate Final length (cm):  3.5 Reason for type of repair: reduce tension to allow closure, reduce the risk of dehiscence, infection, and necrosis and reduce subcutaneous dead space and avoid a hematoma   Undermining: edges undermined   Subcutaneous layers (deep stitches):  Suture size:  3-0 Suture type: Vicryl (polyglactin 910)   Stitches:  Buried vertical  mattress Fine/surface layer approximation (top stitches):  Suture size:  4-0 Suture type: nylon   Stitches: simple interrupted   Suture removal (days):  7 Hemostasis achieved with: pressure Outcome: patient tolerated procedure well with no complications   Post-procedure details: sterile dressing applied and wound care instructions given   Dressing type: pressure dressing (mupirocin)    Specimen 1 - Surgical pathology Differential Diagnosis: D48.5 Bx proven Atypical nevus Check Margins: yes Pink bx site 0.7cm Tag at anterior 9:00 tip  Return in about 1 week (around 08/24/2020) for suture removal.  I, Sonya Hupman, RMA, am acting as scribe for Brendolyn Patty, MD . Documentation: I have reviewed the above documentation for accuracy and completeness, and I agree with the above.  Brendolyn Patty MD

## 2020-08-17 NOTE — Patient Instructions (Signed)

## 2020-08-19 ENCOUNTER — Telehealth: Payer: Self-pay

## 2020-08-19 NOTE — Telephone Encounter (Signed)
Patient doing well after Monday's surgery.Kathleen Glenn

## 2020-08-24 ENCOUNTER — Other Ambulatory Visit: Payer: Self-pay

## 2020-08-24 ENCOUNTER — Ambulatory Visit (INDEPENDENT_AMBULATORY_CARE_PROVIDER_SITE_OTHER): Payer: Medicare Other | Admitting: Dermatology

## 2020-08-24 DIAGNOSIS — Z4802 Encounter for removal of sutures: Secondary | ICD-10-CM

## 2020-08-24 DIAGNOSIS — D2262 Melanocytic nevi of left upper limb, including shoulder: Secondary | ICD-10-CM

## 2020-08-24 DIAGNOSIS — D229 Melanocytic nevi, unspecified: Secondary | ICD-10-CM

## 2020-08-24 NOTE — Progress Notes (Signed)
   Follow-Up Visit   Subjective  Kathleen Glenn is a 74 y.o. female who presents for the following: Post Op (Atypical nevus, margins free of the left post upper arm above elbow). Healing well.   The following portions of the chart were reviewed this encounter and updated as appropriate:       Review of Systems:  No other skin or systemic complaints except as noted in HPI or Assessment and Plan.  Objective  Well appearing patient in no apparent distress; mood and affect are within normal limits.  A focused examination was performed including face, left arm. Relevant physical exam findings are noted in the Assessment and Plan.  Objective  Left Post Upper Arm above elbow: Excision site healing well, no evidence of infection    Assessment & Plan  Atypical nevus Left Post Upper Arm above elbow  Wound cleansed, sutures removed, wound cleansed and steri strips applied. Discussed pathology results.  Margins free  Return in about 6 months (around 02/22/2021) for Hx Atypical nevus.  IJamesetta Orleans, CMA, am acting as scribe for Brendolyn Patty, MD .  Documentation: I have reviewed the above documentation for accuracy and completeness, and I agree with the above.  Brendolyn Patty MD

## 2020-09-14 DIAGNOSIS — H353122 Nonexudative age-related macular degeneration, left eye, intermediate dry stage: Secondary | ICD-10-CM | POA: Diagnosis not present

## 2020-09-16 ENCOUNTER — Other Ambulatory Visit: Payer: Self-pay | Admitting: Physician Assistant

## 2020-09-16 DIAGNOSIS — I10 Essential (primary) hypertension: Secondary | ICD-10-CM

## 2020-09-16 NOTE — Telephone Encounter (Signed)
Attempted to call patient to schedule appointment- mailbox is full- unable to leave message. Courtesy RF #[phone] and note to pharmacy

## 2020-09-25 ENCOUNTER — Telehealth: Payer: Medicare Other | Admitting: Physician Assistant

## 2020-10-02 ENCOUNTER — Ambulatory Visit (INDEPENDENT_AMBULATORY_CARE_PROVIDER_SITE_OTHER): Payer: Medicare HMO | Admitting: Physician Assistant

## 2020-10-02 VITALS — BP 139/79

## 2020-10-02 DIAGNOSIS — I10 Essential (primary) hypertension: Secondary | ICD-10-CM

## 2020-10-02 DIAGNOSIS — Z1211 Encounter for screening for malignant neoplasm of colon: Secondary | ICD-10-CM

## 2020-10-02 MED ORDER — TRIAMTERENE-HCTZ 37.5-25 MG PO TABS
1.0000 | ORAL_TABLET | Freq: Every day | ORAL | 3 refills | Status: DC
Start: 2020-10-02 — End: 2021-10-11

## 2020-10-02 NOTE — Progress Notes (Signed)
Virtual telephone visit    Virtual Visit via Telephone Note   This visit type was conducted due to national recommendations for restrictions regarding the COVID-19 Pandemic (e.g. social distancing) in an effort to limit this patient's exposure and mitigate transmission in our community. Due to her co-morbid illnesses, this patient is at least at moderate risk for complications without adequate follow up. This format is felt to be most appropriate for this patient at this time. The patient did not have access to video technology or had technical difficulties with video requiring transitioning to audio format only (telephone). Physical exam was limited to content and character of the telephone converstion.    Patient location: Home Provider location: [facility]  I discussed the limitations of evaluation and management by telemedicine and the availability of in person appointments. The patient expressed understanding and agreed to proceed.   Visit Date: 10/02/2020  Today's healthcare provider: Mar Daring, PA-C   Chief Complaint  Patient presents with  . Hypertension   Subjective    HPI  Hypertension, follow-up  BP Readings from Last 3 Encounters:  10/02/20 139/79  01/01/18 (!) 158/84  12/28/17 (!) 144/92   Wt Readings from Last 3 Encounters:  09/20/19 208 lb (94.3 kg)  01/01/18 208 lb (94.3 kg)  12/13/16 216 lb (98 kg)     She was last seen for hypertension 2 years ago.  She reports excellent compliance with treatment. She is not having side effects.  She is following a Low Sodium diet. She is exercising. She does not smoke.  Use of agents associated with hypertension: none.   Outside blood pressures are 130-140's/70-80's. Symptoms: No chest pain No chest pressure  No palpitations No syncope  No dyspnea No orthopnea  No paroxysmal nocturnal dyspnea No lower extremity edema   Pertinent labs: Lab Results  Component Value Date   CHOL 164  01/01/2018   HDL 51 01/01/2018   LDLCALC 99 01/01/2018   TRIG 71 01/01/2018   CHOLHDL 3.2 01/01/2018   Lab Results  Component Value Date   NA 137 01/01/2018   K 3.7 01/01/2018   CREATININE 0.90 01/01/2018   GFRNONAA 65 01/01/2018   GFRAA 75 01/01/2018   GLUCOSE 95 01/01/2018     The 10-year ASCVD risk score (Goff DC Jr., et al., 2013) is: 19.4%   Patient has been trying to isolate as much as she can since she has a few close friends and relatives, including her sister that lives in a nursing facility, that are very high risk. That is why she has not been in the office and chose to do a telephone visit today for follow up. She reports overall she is feeling great and has no complaints today. ---------------------------------------------------------------------------------------------------     Patient Active Problem List   Diagnosis Date Noted  . Colo-vesical fistula 10/07/2015  . Rectovaginal fistula 07/14/2015  . Vaginal anomaly 07/09/2015  . Acquired deformity of finger 12/25/2014  . Primary cancer of endometrium ([place]) 12/25/2014  . Blood glucose elevated 12/25/2014  . Adult BMI 30+ 12/25/2014  . Hemorrhage, postmenopausal 12/25/2014  . Basal cell papilloma 12/25/2014  . Leg varices 12/25/2014  . Essential hypertension 05/03/2013  . Abnormal EKG 05/03/2013  . Malignant neoplasm of corpus uteri, except isthmus ([place]) 03/29/2012   Past Medical History:  Diagnosis Date  . Atypical mole 06/23/2020   L post arm above elbow, excised 08/17/20  . Basal cell carcinoma 02/06/2017   Left medial breast. Superficial.   .  Basal cell carcinoma 02/06/2017   Right chin. Nodular pattern.  . Endometrial cancer ([place]) 2013  . Hypertension    Social History   Tobacco Use  . Smoking status: Never Smoker  . Smokeless tobacco: Never Used  Vaping Use  . Vaping Use: Never used  Substance Use Topics  . Alcohol use: Yes    Comment: occasional glass of wine  . Drug use: No   Allergies   Allergen Reactions  . Diclofenac Sodium Swelling    Eye drops Eye drops Eye drops    Medications: Outpatient Medications Prior to Visit  Medication Sig  . Biotin 1 MG CAPS Take by mouth daily.   . Calcium Carbonate-Vit D-Min (CALCIUM 600+D PLUS MINERALS) 600-400 MG-UNIT TABS Take by mouth.  . magnesium oxide (MAG-OX) 400 MG tablet Take 400 mg by mouth daily.  . Multiple Vitamin (MULTIVITAMIN) capsule Take by mouth.  . Multiple Vitamins-Minerals (PRESERVISION AREDS 2 PO) Take by mouth.  Marland Kitchen RA TURMERIC PO Take by mouth daily.  . [DISCONTINUED] triamterene-hydrochlorothiazide (MAXZIDE-25) 37.5-25 MG tablet TAKE 1 TABLET BY MOUTH EVERY DAY  . [DISCONTINUED] Omega-3 Fatty Acids (FISH OIL) 1200 MG CAPS Take 1 capsule by mouth daily.   No facility-administered medications prior to visit.    Review of Systems  Constitutional: Negative.   Respiratory: Negative.   Cardiovascular: Negative.   Gastrointestinal: Negative.   Endocrine: Negative.   Neurological: Negative for dizziness, light-headedness and headaches.      Objective    BP 139/79      Assessment & Plan     1. Essential hypertension Stable. Diagnosis pulled for medication refill. Continue current medical treatment plan. - triamterene-hydrochlorothiazide (MAXZIDE-25) 37.5-25 MG tablet; Take 1 tablet by mouth daily.  Dispense: 90 tablet; Refill: 3  2. Colon cancer screening Due for repeat colon cancer screening with cologuard. Cologuard reordered as below.  - Cologuard   No follow-ups on file.    I discussed the assessment and treatment plan with the patient. The patient was provided an opportunity to ask questions and all were answered. The patient agreed with the plan and demonstrated an understanding of the instructions.   The patient was advised to call back or seek an in-person evaluation if the symptoms worsen or if the condition fails to improve as anticipated.  I provided 13 minutes of non-face-to-face  time during this encounter.  Reynolds Bowl, PA-C, have reviewed all documentation for this visit. The documentation on 10/04/20 for the exam, diagnosis, procedures, and orders are all accurate and complete.  [place] [facility] [phone] (phone) [phone] (fax)  [place]

## 2020-10-04 ENCOUNTER — Encounter: Payer: Self-pay | Admitting: Physician Assistant

## 2020-10-26 DIAGNOSIS — H353211 Exudative age-related macular degeneration, right eye, with active choroidal neovascularization: Secondary | ICD-10-CM | POA: Diagnosis not present

## 2020-12-02 ENCOUNTER — Telehealth: Payer: Self-pay

## 2020-12-02 NOTE — Telephone Encounter (Signed)
Called pt to r/s her cx AWV for 11/30/20 and pt declined and stated she did not want to r/s the AWV at this time.

## 2020-12-07 DIAGNOSIS — H353211 Exudative age-related macular degeneration, right eye, with active choroidal neovascularization: Secondary | ICD-10-CM | POA: Diagnosis not present

## 2020-12-30 DIAGNOSIS — M76829 Posterior tibial tendinitis, unspecified leg: Secondary | ICD-10-CM | POA: Diagnosis not present

## 2020-12-30 DIAGNOSIS — M79672 Pain in left foot: Secondary | ICD-10-CM | POA: Diagnosis not present

## 2020-12-30 DIAGNOSIS — M25572 Pain in left ankle and joints of left foot: Secondary | ICD-10-CM | POA: Diagnosis not present

## 2020-12-30 DIAGNOSIS — M2042 Other hammer toe(s) (acquired), left foot: Secondary | ICD-10-CM | POA: Diagnosis not present

## 2020-12-30 DIAGNOSIS — M2041 Other hammer toe(s) (acquired), right foot: Secondary | ICD-10-CM | POA: Diagnosis not present

## 2020-12-30 DIAGNOSIS — M2012 Hallux valgus (acquired), left foot: Secondary | ICD-10-CM | POA: Diagnosis not present

## 2020-12-30 DIAGNOSIS — M19072 Primary osteoarthritis, left ankle and foot: Secondary | ICD-10-CM | POA: Diagnosis not present

## 2021-01-18 DIAGNOSIS — H353211 Exudative age-related macular degeneration, right eye, with active choroidal neovascularization: Secondary | ICD-10-CM | POA: Diagnosis not present

## 2021-02-02 DIAGNOSIS — M7661 Achilles tendinitis, right leg: Secondary | ICD-10-CM | POA: Diagnosis not present

## 2021-02-02 DIAGNOSIS — M19072 Primary osteoarthritis, left ankle and foot: Secondary | ICD-10-CM | POA: Diagnosis not present

## 2021-02-02 DIAGNOSIS — M76829 Posterior tibial tendinitis, unspecified leg: Secondary | ICD-10-CM | POA: Diagnosis not present

## 2021-02-02 DIAGNOSIS — M7731 Calcaneal spur, right foot: Secondary | ICD-10-CM | POA: Diagnosis not present

## 2021-02-09 ENCOUNTER — Ambulatory Visit (INDEPENDENT_AMBULATORY_CARE_PROVIDER_SITE_OTHER): Payer: Medicare HMO | Admitting: Dermatology

## 2021-02-09 ENCOUNTER — Other Ambulatory Visit: Payer: Self-pay

## 2021-02-09 DIAGNOSIS — L820 Inflamed seborrheic keratosis: Secondary | ICD-10-CM | POA: Diagnosis not present

## 2021-02-09 DIAGNOSIS — D692 Other nonthrombocytopenic purpura: Secondary | ICD-10-CM | POA: Diagnosis not present

## 2021-02-09 DIAGNOSIS — Z86018 Personal history of other benign neoplasm: Secondary | ICD-10-CM

## 2021-02-09 DIAGNOSIS — L814 Other melanin hyperpigmentation: Secondary | ICD-10-CM | POA: Diagnosis not present

## 2021-02-09 DIAGNOSIS — L821 Other seborrheic keratosis: Secondary | ICD-10-CM | POA: Diagnosis not present

## 2021-02-09 NOTE — Patient Instructions (Signed)

## 2021-02-09 NOTE — Progress Notes (Signed)
   Follow-Up Visit   Subjective  Kathleen Glenn is a 75 y.o. female who presents for the following: hx of Dysplastic Nevus (L post upper arm, excised 08/17/20, 52m f/u) and check spot (R calf, no symptoms, just noticed).   The following portions of the chart were reviewed this encounter and updated as appropriate:       Review of Systems:  No other skin or systemic complaints except as noted in HPI or Assessment and Plan.  Objective  Well appearing patient in no apparent distress; mood and affect are within normal limits.  A focused examination was performed including arms, face, R calf. Relevant physical exam findings are noted in the Assessment and Plan.  Objective  Left post upper arm: Scar with no evidence of recurrence.   Objective  Right lat lower calf: Erythematous keratotic or waxy stuck-on papule 1.5cm, similar smaller lesion superior   Assessment & Plan    Lentigines - Scattered tan macules - Due to sun exposure - Benign-appering, observe - Recommend daily broad spectrum sunscreen SPF 30+ to sun-exposed areas, reapply every 2 hours as needed. - Call for any changes  Seborrheic Keratoses - Stuck-on, waxy, tan-brown papules and/or plaques  - Benign-appearing - Discussed benign etiology and prognosis. - Observe - Call for any changes  History of dysplastic nevus Left post upper arm  Clear. Observe for recurrence. Call clinic for new or changing lesions.  Recommend regular skin exams, daily broad-spectrum spf 30+ sunscreen use, and photoprotection.     Inflamed seborrheic keratosis Right lat lower calf  Reassured benign age-related growth.  Recommend observation.  Discussed cryotherapy if spot(s) become irritated or inflamed.  Pt defers treatment today since not bothersome   Recommend Amlactin or Eucerin roughness relief or [place] rough and bumpy   Purpura - Chronic; persistent and recurrent.  Treatable, but not curable. - Violaceous macules and  patches - Benign - Related to trauma, age, sun damage and/or use of blood thinners, chronic use of topical and/or oral steroids - Observe - Can use OTC arnica containing moisturizer such as Dermend Bruise Formula if desired - Call for worsening or other concerns - Recommend Dermend, info given  Return for as scheduled for TBSE , Hx of Dysplastic nevi.  I, Othelia Pulling, RMA, am acting as scribe for Brendolyn Patty, MD .  Documentation: I have reviewed the above documentation for accuracy and completeness, and I agree with the above.  Brendolyn Patty MD

## 2021-03-04 DIAGNOSIS — H353122 Nonexudative age-related macular degeneration, left eye, intermediate dry stage: Secondary | ICD-10-CM | POA: Diagnosis not present

## 2021-03-04 DIAGNOSIS — H353211 Exudative age-related macular degeneration, right eye, with active choroidal neovascularization: Secondary | ICD-10-CM | POA: Diagnosis not present

## 2021-04-01 DIAGNOSIS — H353211 Exudative age-related macular degeneration, right eye, with active choroidal neovascularization: Secondary | ICD-10-CM | POA: Diagnosis not present

## 2021-04-22 ENCOUNTER — Other Ambulatory Visit: Payer: Self-pay

## 2021-04-22 ENCOUNTER — Encounter: Payer: Self-pay | Admitting: Emergency Medicine

## 2021-04-22 DIAGNOSIS — R1084 Generalized abdominal pain: Secondary | ICD-10-CM | POA: Diagnosis not present

## 2021-04-22 DIAGNOSIS — Z8616 Personal history of COVID-19: Secondary | ICD-10-CM

## 2021-04-22 DIAGNOSIS — K436 Other and unspecified ventral hernia with obstruction, without gangrene: Secondary | ICD-10-CM | POA: Diagnosis present

## 2021-04-22 DIAGNOSIS — Z833 Family history of diabetes mellitus: Secondary | ICD-10-CM

## 2021-04-22 DIAGNOSIS — Z90710 Acquired absence of both cervix and uterus: Secondary | ICD-10-CM

## 2021-04-22 DIAGNOSIS — Z85828 Personal history of other malignant neoplasm of skin: Secondary | ICD-10-CM

## 2021-04-22 DIAGNOSIS — K430 Incisional hernia with obstruction, without gangrene: Principal | ICD-10-CM | POA: Diagnosis present

## 2021-04-22 DIAGNOSIS — Z9049 Acquired absence of other specified parts of digestive tract: Secondary | ICD-10-CM

## 2021-04-22 DIAGNOSIS — Z79899 Other long term (current) drug therapy: Secondary | ICD-10-CM

## 2021-04-22 DIAGNOSIS — R112 Nausea with vomiting, unspecified: Secondary | ICD-10-CM | POA: Diagnosis not present

## 2021-04-22 DIAGNOSIS — K5730 Diverticulosis of large intestine without perforation or abscess without bleeding: Secondary | ICD-10-CM | POA: Diagnosis present

## 2021-04-22 DIAGNOSIS — K56600 Partial intestinal obstruction, unspecified as to cause: Secondary | ICD-10-CM | POA: Diagnosis present

## 2021-04-22 DIAGNOSIS — I10 Essential (primary) hypertension: Secondary | ICD-10-CM | POA: Diagnosis present

## 2021-04-22 DIAGNOSIS — Z6835 Body mass index (BMI) 35.0-35.9, adult: Secondary | ICD-10-CM

## 2021-04-22 DIAGNOSIS — Z8249 Family history of ischemic heart disease and other diseases of the circulatory system: Secondary | ICD-10-CM

## 2021-04-22 DIAGNOSIS — Z8542 Personal history of malignant neoplasm of other parts of uterus: Secondary | ICD-10-CM

## 2021-04-22 DIAGNOSIS — M4316 Spondylolisthesis, lumbar region: Secondary | ICD-10-CM | POA: Diagnosis present

## 2021-04-22 DIAGNOSIS — Z8261 Family history of arthritis: Secondary | ICD-10-CM

## 2021-04-22 DIAGNOSIS — Z808 Family history of malignant neoplasm of other organs or systems: Secondary | ICD-10-CM

## 2021-04-22 DIAGNOSIS — M4800 Spinal stenosis, site unspecified: Secondary | ICD-10-CM | POA: Diagnosis present

## 2021-04-22 DIAGNOSIS — Z888 Allergy status to other drugs, medicaments and biological substances status: Secondary | ICD-10-CM

## 2021-04-22 DIAGNOSIS — Z743 Need for continuous supervision: Secondary | ICD-10-CM | POA: Diagnosis not present

## 2021-04-22 DIAGNOSIS — D1771 Benign lipomatous neoplasm of kidney: Secondary | ICD-10-CM | POA: Diagnosis present

## 2021-04-22 DIAGNOSIS — R110 Nausea: Secondary | ICD-10-CM | POA: Diagnosis not present

## 2021-04-22 LAB — CBC
HCT: 43.9 % (ref 36.0–46.0)
Hemoglobin: 14.8 g/dL (ref 12.0–15.0)
MCH: 31.4 pg (ref 26.0–34.0)
MCHC: 33.7 g/dL (ref 30.0–36.0)
MCV: 93.2 fL (ref 80.0–100.0)
Platelets: 325 10*3/uL (ref 150–400)
RBC: 4.71 MIL/uL (ref 3.87–5.11)
RDW: 13.6 % (ref 11.5–15.5)
WBC: 16.9 10*3/uL — ABNORMAL HIGH (ref 4.0–10.5)
nRBC: 0 % (ref 0.0–0.2)

## 2021-04-22 LAB — COMPREHENSIVE METABOLIC PANEL
ALT: 13 U/L (ref 0–44)
AST: 17 U/L (ref 15–41)
Albumin: 4.1 g/dL (ref 3.5–5.0)
Alkaline Phosphatase: 74 U/L (ref 38–126)
Anion gap: 12 (ref 5–15)
BUN: 23 mg/dL (ref 8–23)
CO2: 28 mmol/L (ref 22–32)
Calcium: 9.6 mg/dL (ref 8.9–10.3)
Chloride: 95 mmol/L — ABNORMAL LOW (ref 98–111)
Creatinine, Ser: 0.9 mg/dL (ref 0.44–1.00)
GFR, Estimated: >60 mL/min (ref >60)
Glucose, Bld: 169 mg/dL — ABNORMAL HIGH (ref 70–99)
Potassium: 3.6 mmol/L (ref 3.5–5.1)
Sodium: 135 mmol/L (ref 135–145)
Total Bilirubin: 0.9 mg/dL (ref 0.3–1.2)
Total Protein: 8 g/dL (ref 6.5–8.1)

## 2021-04-22 LAB — TROPONIN I (HIGH SENSITIVITY): Troponin I (High Sensitivity): 4 ng/L (ref <18)

## 2021-04-22 LAB — LIPASE, BLOOD: Lipase: 37 U/L (ref 11–51)

## 2021-04-22 MED ORDER — ONDANSETRON 4 MG PO TBDP
4.0000 mg | ORAL_TABLET | Freq: Once | ORAL | Status: AC | PRN
  Administered 2021-04-22: 4 mg via ORAL
  Filled 2021-04-22: qty 1

## 2021-04-22 NOTE — ED Notes (Signed)
Pt unable to void at this time. 

## 2021-04-22 NOTE — ED Triage Notes (Signed)
EMS brings pt in from home for c/o N/V and abd pain since 4pm

## 2021-04-22 NOTE — ED Triage Notes (Signed)
Pt arrived via [facility] from home with reports of vomiting, dizziness, LLQ abd pain, back pain. Pt states she has a hernia and the pain started today around 4pm.  Pt states she broke out in a cold sweat when she was vomiting and got dizzy.

## 2021-04-23 ENCOUNTER — Emergency Department: Payer: Medicare HMO | Admitting: Certified Registered Nurse Anesthetist

## 2021-04-23 ENCOUNTER — Emergency Department: Payer: Medicare HMO

## 2021-04-23 ENCOUNTER — Encounter
Admission: EM | Disposition: A | Discharge status: Home / Self Care | Payer: Self-pay | Source: Home / Self Care | Attending: Surgery

## 2021-04-23 ENCOUNTER — Inpatient Hospital Stay
Admission: EM | Admit: 2021-04-23 | Discharge: 2021-04-28 | DRG: 354 | Disposition: A | Discharge status: Home / Self Care | Payer: Medicare HMO | Attending: Surgery | Admitting: Surgery

## 2021-04-23 DIAGNOSIS — K439 Ventral hernia without obstruction or gangrene: Secondary | ICD-10-CM | POA: Diagnosis not present

## 2021-04-23 DIAGNOSIS — Z888 Allergy status to other drugs, medicaments and biological substances status: Secondary | ICD-10-CM | POA: Diagnosis not present

## 2021-04-23 DIAGNOSIS — M4316 Spondylolisthesis, lumbar region: Secondary | ICD-10-CM | POA: Diagnosis present

## 2021-04-23 DIAGNOSIS — K430 Incisional hernia with obstruction, without gangrene: Secondary | ICD-10-CM | POA: Diagnosis present

## 2021-04-23 DIAGNOSIS — R112 Nausea with vomiting, unspecified: Secondary | ICD-10-CM

## 2021-04-23 DIAGNOSIS — Z8261 Family history of arthritis: Secondary | ICD-10-CM | POA: Diagnosis not present

## 2021-04-23 DIAGNOSIS — N390 Urinary tract infection, site not specified: Secondary | ICD-10-CM | POA: Diagnosis not present

## 2021-04-23 DIAGNOSIS — K436 Other and unspecified ventral hernia with obstruction, without gangrene: Secondary | ICD-10-CM | POA: Diagnosis not present

## 2021-04-23 DIAGNOSIS — Z8542 Personal history of malignant neoplasm of other parts of uterus: Secondary | ICD-10-CM | POA: Diagnosis not present

## 2021-04-23 DIAGNOSIS — Z4682 Encounter for fitting and adjustment of non-vascular catheter: Secondary | ICD-10-CM | POA: Diagnosis not present

## 2021-04-23 DIAGNOSIS — I10 Essential (primary) hypertension: Secondary | ICD-10-CM | POA: Diagnosis not present

## 2021-04-23 DIAGNOSIS — R1084 Generalized abdominal pain: Secondary | ICD-10-CM

## 2021-04-23 DIAGNOSIS — M4800 Spinal stenosis, site unspecified: Secondary | ICD-10-CM | POA: Diagnosis present

## 2021-04-23 DIAGNOSIS — K56600 Partial intestinal obstruction, unspecified as to cause: Secondary | ICD-10-CM | POA: Diagnosis not present

## 2021-04-23 DIAGNOSIS — Z833 Family history of diabetes mellitus: Secondary | ICD-10-CM | POA: Diagnosis not present

## 2021-04-23 DIAGNOSIS — K5669 Other partial intestinal obstruction: Secondary | ICD-10-CM

## 2021-04-23 DIAGNOSIS — Z9049 Acquired absence of other specified parts of digestive tract: Secondary | ICD-10-CM | POA: Diagnosis not present

## 2021-04-23 DIAGNOSIS — Z808 Family history of malignant neoplasm of other organs or systems: Secondary | ICD-10-CM | POA: Diagnosis not present

## 2021-04-23 DIAGNOSIS — Z85828 Personal history of other malignant neoplasm of skin: Secondary | ICD-10-CM | POA: Diagnosis not present

## 2021-04-23 DIAGNOSIS — R1110 Vomiting, unspecified: Secondary | ICD-10-CM | POA: Diagnosis not present

## 2021-04-23 DIAGNOSIS — Z79899 Other long term (current) drug therapy: Secondary | ICD-10-CM | POA: Diagnosis not present

## 2021-04-23 DIAGNOSIS — Z8616 Personal history of COVID-19: Secondary | ICD-10-CM | POA: Diagnosis not present

## 2021-04-23 DIAGNOSIS — Z6835 Body mass index (BMI) 35.0-35.9, adult: Secondary | ICD-10-CM | POA: Diagnosis not present

## 2021-04-23 DIAGNOSIS — Z90710 Acquired absence of both cervix and uterus: Secondary | ICD-10-CM | POA: Diagnosis not present

## 2021-04-23 DIAGNOSIS — K6389 Other specified diseases of intestine: Secondary | ICD-10-CM | POA: Diagnosis not present

## 2021-04-23 DIAGNOSIS — D1771 Benign lipomatous neoplasm of kidney: Secondary | ICD-10-CM | POA: Diagnosis present

## 2021-04-23 DIAGNOSIS — R109 Unspecified abdominal pain: Secondary | ICD-10-CM | POA: Diagnosis not present

## 2021-04-23 DIAGNOSIS — Z8249 Family history of ischemic heart disease and other diseases of the circulatory system: Secondary | ICD-10-CM | POA: Diagnosis not present

## 2021-04-23 DIAGNOSIS — K5730 Diverticulosis of large intestine without perforation or abscess without bleeding: Secondary | ICD-10-CM | POA: Diagnosis present

## 2021-04-23 HISTORY — PX: INSERTION OF MESH: SHX5868

## 2021-04-23 HISTORY — PX: VENTRAL HERNIA REPAIR: SHX424

## 2021-04-23 LAB — URINALYSIS, COMPLETE (UACMP) WITH MICROSCOPIC
Bilirubin Urine: NEGATIVE
Glucose, UA: NEGATIVE mg/dL
Hgb urine dipstick: NEGATIVE
Ketones, ur: 5 mg/dL — AB
Nitrite: NEGATIVE
Protein, ur: NEGATIVE mg/dL
Specific Gravity, Urine: 1.019 (ref 1.005–1.030)
pH: 6 (ref 5.0–8.0)

## 2021-04-23 LAB — RESP PANEL BY RT-PCR (FLU A&B, COVID) ARPGX2
Influenza A by PCR: NEGATIVE
Influenza B by PCR: NEGATIVE
SARS Coronavirus 2 by RT PCR: POSITIVE — AB

## 2021-04-23 SURGERY — REPAIR, HERNIA, VENTRAL
Anesthesia: General

## 2021-04-23 MED ORDER — FENTANYL CITRATE (PF) 100 MCG/2ML IJ SOLN
INTRAMUSCULAR | Status: AC
  Filled 2021-04-23: qty 2

## 2021-04-23 MED ORDER — 0.9 % SODIUM CHLORIDE (POUR BTL) OPTIME
TOPICAL | Status: DC | PRN
  Administered 2021-04-23: 200 mL

## 2021-04-23 MED ORDER — PHENYLEPHRINE HCL (PRESSORS) 10 MG/ML IV SOLN
INTRAVENOUS | Status: DC | PRN
  Administered 2021-04-23 (×3): 100 ug via INTRAVENOUS

## 2021-04-23 MED ORDER — SODIUM CHLORIDE 0.9 % IV BOLUS (SEPSIS)
500.0000 mL | Freq: Once | INTRAVENOUS | Status: AC
  Administered 2021-04-23: 500 mL via INTRAVENOUS

## 2021-04-23 MED ORDER — FENTANYL CITRATE (PF) 100 MCG/2ML IJ SOLN
INTRAMUSCULAR | Status: AC
  Administered 2021-04-23: 25 ug via INTRAVENOUS
  Filled 2021-04-23: qty 2

## 2021-04-23 MED ORDER — HYDROMORPHONE HCL 1 MG/ML IJ SOLN
INTRAMUSCULAR | Status: DC | PRN
  Administered 2021-04-23 (×2): .25 mg via INTRAVENOUS

## 2021-04-23 MED ORDER — SUCCINYLCHOLINE CHLORIDE 200 MG/10ML IV SOSY
PREFILLED_SYRINGE | INTRAVENOUS | Status: DC | PRN
  Administered 2021-04-23: 120 mg via INTRAVENOUS

## 2021-04-23 MED ORDER — PHENYLEPHRINE HCL (PRESSORS) 10 MG/ML IV SOLN
INTRAVENOUS | Status: AC
  Filled 2021-04-23: qty 1

## 2021-04-23 MED ORDER — PROPOFOL 10 MG/ML IV BOLUS
INTRAVENOUS | Status: AC
  Filled 2021-04-23: qty 20

## 2021-04-23 MED ORDER — MIDAZOLAM HCL 2 MG/2ML IJ SOLN
INTRAMUSCULAR | Status: DC | PRN
  Administered 2021-04-23: 1 mg via INTRAVENOUS

## 2021-04-23 MED ORDER — METHOCARBAMOL 500 MG PO TABS: 500.0000 mg | ORAL_TABLET | Freq: Four times a day (QID) | ORAL | Status: DC | PRN

## 2021-04-23 MED ORDER — MIDAZOLAM HCL 2 MG/2ML IJ SOLN
INTRAMUSCULAR | Status: AC
  Filled 2021-04-23: qty 2

## 2021-04-23 MED ORDER — ROCURONIUM BROMIDE 10 MG/ML (PF) SYRINGE
PREFILLED_SYRINGE | INTRAVENOUS | Status: AC
  Filled 2021-04-23: qty 20

## 2021-04-23 MED ORDER — LIDOCAINE HCL (CARDIAC) PF 100 MG/5ML IV SOSY
PREFILLED_SYRINGE | INTRAVENOUS | Status: DC | PRN
  Administered 2021-04-23: 100 mg via INTRAVENOUS

## 2021-04-23 MED ORDER — BUPIVACAINE-MELOXICAM ER 400-12 MG/14ML IJ SOLN
INTRAMUSCULAR | Status: DC | PRN
Start: 2021-04-23 — End: 2021-04-23
  Administered 2021-04-23: 400 mg

## 2021-04-23 MED ORDER — PANTOPRAZOLE SODIUM 40 MG IV SOLR
40.0000 mg | Freq: Every day | INTRAVENOUS | Status: DC
  Administered 2021-04-23 – 2021-04-25 (×3): 40 mg via INTRAVENOUS
  Filled 2021-04-23 (×3): qty 40

## 2021-04-23 MED ORDER — TRAMADOL HCL 50 MG PO TABS
50.0000 mg | ORAL_TABLET | Freq: Four times a day (QID) | ORAL | Status: DC | PRN
  Administered 2021-04-27: 50 mg via ORAL
  Filled 2021-04-23: qty 1

## 2021-04-23 MED ORDER — OXYCODONE HCL 5 MG PO TABS
5.0000 mg | ORAL_TABLET | ORAL | Status: DC | PRN
  Administered 2021-04-25 – 2021-04-26 (×2): 5 mg via ORAL
  Administered 2021-04-26: 10 mg via ORAL
  Administered 2021-04-26: 5 mg via ORAL
  Administered 2021-04-27 (×2): 10 mg via ORAL
  Filled 2021-04-23: qty 1
  Filled 2021-04-23: qty 2
  Filled 2021-04-23 (×2): qty 1
  Filled 2021-04-23: qty 2
  Filled 2021-04-23: qty 1
  Filled 2021-04-23: qty 2

## 2021-04-23 MED ORDER — ROCURONIUM BROMIDE 100 MG/10ML IV SOLN
INTRAVENOUS | Status: DC | PRN
  Administered 2021-04-23: 10 mg via INTRAVENOUS
  Administered 2021-04-23: 50 mg via INTRAVENOUS
  Administered 2021-04-23: 20 mg via INTRAVENOUS
  Administered 2021-04-23 (×3): 10 mg via INTRAVENOUS

## 2021-04-23 MED ORDER — ONDANSETRON HCL 4 MG/2ML IJ SOLN: 4.0000 mg | Freq: Once | INTRAMUSCULAR | Status: DC | PRN

## 2021-04-23 MED ORDER — ACETAMINOPHEN 10 MG/ML IV SOLN
INTRAVENOUS | Status: DC | PRN
  Administered 2021-04-23: 1000 mg via INTRAVENOUS

## 2021-04-23 MED ORDER — FENTANYL CITRATE (PF) 100 MCG/2ML IJ SOLN
INTRAMUSCULAR | Status: DC | PRN
  Administered 2021-04-23 (×2): 50 ug via INTRAVENOUS

## 2021-04-23 MED ORDER — SUGAMMADEX SODIUM 200 MG/2ML IV SOLN
INTRAVENOUS | Status: DC | PRN
  Administered 2021-04-23: 200 mg via INTRAVENOUS

## 2021-04-23 MED ORDER — MORPHINE SULFATE (PF) 2 MG/ML IV SOLN
1.0000 mg | INTRAVENOUS | Status: DC | PRN
  Administered 2021-04-23 – 2021-04-24 (×4): 1 mg via INTRAMUSCULAR
  Filled 2021-04-23 (×4): qty 1

## 2021-04-23 MED ORDER — IOHEXOL 350 MG/ML SOLN
80.0000 mL | Freq: Once | INTRAVENOUS | Status: AC | PRN
  Administered 2021-04-23: 80 mL via INTRAVENOUS

## 2021-04-23 MED ORDER — ONDANSETRON HCL 4 MG/2ML IJ SOLN
INTRAMUSCULAR | Status: DC | PRN
  Administered 2021-04-23: 4 mg via INTRAVENOUS

## 2021-04-23 MED ORDER — ONDANSETRON 4 MG PO TBDP
4.0000 mg | ORAL_TABLET | Freq: Four times a day (QID) | ORAL | Status: DC | PRN
  Administered 2021-04-24: 4 mg via ORAL
  Filled 2021-04-23 (×2): qty 1

## 2021-04-23 MED ORDER — ACETAMINOPHEN 500 MG PO TABS
1000.0000 mg | ORAL_TABLET | Freq: Four times a day (QID) | ORAL | Status: DC
  Administered 2021-04-23 – 2021-04-28 (×20): 1000 mg via ORAL
  Filled 2021-04-23 (×21): qty 2

## 2021-04-23 MED ORDER — LACTATED RINGERS IV SOLN: INTRAVENOUS | Status: DC | PRN

## 2021-04-23 MED ORDER — KETAMINE HCL 10 MG/ML IJ SOLN
INTRAMUSCULAR | Status: DC | PRN
  Administered 2021-04-23 (×2): 20 mg via INTRAVENOUS

## 2021-04-23 MED ORDER — ONDANSETRON HCL 4 MG/2ML IJ SOLN
4.0000 mg | Freq: Four times a day (QID) | INTRAMUSCULAR | Status: DC | PRN
  Filled 2021-04-23: qty 2

## 2021-04-23 MED ORDER — FENTANYL CITRATE (PF) 100 MCG/2ML IJ SOLN
25.0000 ug | INTRAMUSCULAR | Status: AC | PRN
  Administered 2021-04-23 (×5): 25 ug via INTRAVENOUS

## 2021-04-23 MED ORDER — MEPERIDINE HCL 25 MG/ML IJ SOLN: 6.2500 mg | INTRAMUSCULAR | Status: DC | PRN

## 2021-04-23 MED ORDER — DEXAMETHASONE SODIUM PHOSPHATE 10 MG/ML IJ SOLN
INTRAMUSCULAR | Status: DC | PRN
  Administered 2021-04-23: 10 mg via INTRAVENOUS

## 2021-04-23 MED ORDER — HYDROMORPHONE HCL 1 MG/ML IJ SOLN
INTRAMUSCULAR | Status: AC
  Filled 2021-04-23: qty 1

## 2021-04-23 MED ORDER — TRIAMTERENE-HCTZ 37.5-25 MG PO TABS
1.0000 | ORAL_TABLET | Freq: Every day | ORAL | Status: DC
  Administered 2021-04-24 – 2021-04-28 (×5): 1 via ORAL
  Filled 2021-04-23 (×5): qty 1

## 2021-04-23 MED ORDER — GLYCOPYRROLATE 0.2 MG/ML IJ SOLN
INTRAMUSCULAR | Status: DC | PRN
  Administered 2021-04-23: .2 mg via INTRAVENOUS

## 2021-04-23 MED ORDER — SODIUM CHLORIDE 0.9 % IV SOLN: INTRAVENOUS | Status: DC

## 2021-04-23 MED ORDER — SODIUM CHLORIDE 0.9 % IV SOLN
1.0000 g | Freq: Once | INTRAVENOUS | Status: AC
  Administered 2021-04-23: 1 g via INTRAVENOUS
  Filled 2021-04-23: qty 10

## 2021-04-23 MED ORDER — DEXMEDETOMIDINE (PRECEDEX) IN NS 20 MCG/5ML (4 MCG/ML) IV SYRINGE
PREFILLED_SYRINGE | INTRAVENOUS | Status: DC | PRN
  Administered 2021-04-23 (×5): 4 ug via INTRAVENOUS

## 2021-04-23 MED ORDER — KETAMINE HCL 50 MG/5ML IJ SOSY
PREFILLED_SYRINGE | INTRAMUSCULAR | Status: AC
  Filled 2021-04-23: qty 5

## 2021-04-23 MED ORDER — PROPOFOL 10 MG/ML IV BOLUS
INTRAVENOUS | Status: DC | PRN
  Administered 2021-04-23: 150 mg via INTRAVENOUS

## 2021-04-23 MED ORDER — SODIUM CHLORIDE 0.9 % IV BOLUS (SEPSIS): 1000.0000 mL | Freq: Once | INTRAVENOUS | Status: DC

## 2021-04-23 MED ORDER — GABAPENTIN 300 MG PO CAPS
300.0000 mg | ORAL_CAPSULE | Freq: Two times a day (BID) | ORAL | Status: DC
  Administered 2021-04-23 – 2021-04-28 (×10): 300 mg via ORAL
  Filled 2021-04-23 (×10): qty 1

## 2021-04-23 MED ORDER — CHLORHEXIDINE GLUCONATE CLOTH 2 % EX PADS
6.0000 | MEDICATED_PAD | Freq: Every day | CUTANEOUS | Status: DC
  Administered 2021-04-23 – 2021-04-25 (×3): 6 via TOPICAL

## 2021-04-23 SURGICAL SUPPLY — 48 items
BLADE SURG 15 STRL LF DISP TIS (BLADE) ×1 IMPLANT
BLADE SURG 15 STRL SS (BLADE) ×1
BULB RESERV EVAC DRAIN JP 100C (MISCELLANEOUS) ×2 IMPLANT
CANISTER SUCT 1200ML W/VALVE (MISCELLANEOUS) ×1 IMPLANT
CHLORAPREP W/TINT 26 (MISCELLANEOUS) ×2 IMPLANT
DERMABOND ADVANCED (GAUZE/BANDAGES/DRESSINGS) ×1
DERMABOND ADVANCED .7 DNX12 (GAUZE/BANDAGES/DRESSINGS) ×1 IMPLANT
DRAIN CHANNEL JP 15F RND 16 (MISCELLANEOUS) ×2 IMPLANT
DRAPE LAPAROTOMY 100X77 ABD (DRAPES) ×2 IMPLANT
DRSG OPSITE POSTOP 4X12 (GAUZE/BANDAGES/DRESSINGS) ×1 IMPLANT
ELECT CAUTERY BLADE 6.4 (BLADE) ×2 IMPLANT
ELECT REM PT RETURN 9FT ADLT (ELECTROSURGICAL) ×2
ELECTRODE REM PT RTRN 9FT ADLT (ELECTROSURGICAL) ×1 IMPLANT
GAUZE 4X4 16PLY ~~LOC~~+RFID DBL (SPONGE) ×2 IMPLANT
GLOVE SURG SYN 6.5 ES PF (GLOVE) ×8 IMPLANT
GLOVE SURG SYN 6.5 PF PI (GLOVE) ×1 IMPLANT
GLOVE SURG UNDER POLY LF SZ7 (GLOVE) ×5 IMPLANT
GOWN STRL REUS W/ TWL LRG LVL3 (GOWN DISPOSABLE) ×2 IMPLANT
GOWN STRL REUS W/TWL LRG LVL3 (GOWN DISPOSABLE) ×2
KIT TURNOVER KIT A (KITS) ×2 IMPLANT
LABEL OR SOLS (LABEL) ×2 IMPLANT
MANIFOLD NEPTUNE II (INSTRUMENTS) ×2 IMPLANT
MESH VENTRALIGHT ST 4X6IN (Mesh General) ×1 IMPLANT
MESH VENTRALIGHT ST 8X10 (Mesh General) ×1 IMPLANT
NEEDLE HYPO 22GX1.5 SAFETY (NEEDLE) ×4 IMPLANT
NS IRRIG 500ML POUR BTL (IV SOLUTION) ×2 IMPLANT
PACK BASIN MINOR ARMC (MISCELLANEOUS) ×2 IMPLANT
RETAINER VISCERA MED (MISCELLANEOUS) ×1 IMPLANT
SEALER TISSUE X1 CVD JAW (INSTRUMENTS) ×1 IMPLANT
SPONGE DRAIN TRACH 4X4 STRL 2S (GAUZE/BANDAGES/DRESSINGS) ×1 IMPLANT
SPONGE T-LAP 18X18 ~~LOC~~+RFID (SPONGE) ×1 IMPLANT
STAPLER SKIN PROX 35W (STAPLE) ×1 IMPLANT
SUT ETHIBOND NAB MO 7 #0 18IN (SUTURE) ×2 IMPLANT
SUT ETHILON 3-0 FS-10 30 BLK (SUTURE) ×2
SUT MNCRL 4-0 (SUTURE) ×1
SUT MNCRL 4-0 27XMFL (SUTURE) ×1
SUT PDS AB 0 CT1 27 (SUTURE) ×8 IMPLANT
SUT PDS AB 1 CTX 36 (SUTURE) ×10 IMPLANT
SUT SILK 3 0 (SUTURE) ×1
SUT SILK 3-0 18XBRD TIE 12 (SUTURE) IMPLANT
SUT VIC AB 3-0 SH 27 (SUTURE) ×2
SUT VIC AB 3-0 SH 27X BRD (SUTURE) ×2 IMPLANT
SUTURE EHLN 3-0 FS-10 30 BLK (SUTURE) IMPLANT
SUTURE MNCRL 4-0 27XMF (SUTURE) ×1 IMPLANT
SYR 10ML LL (SYRINGE) ×4 IMPLANT
SYR 20ML LL LF (SYRINGE) ×2 IMPLANT
TRAY FOLEY SLVR 16FR LF STAT (SET/KITS/TRAYS/PACK) ×1 IMPLANT
WATER STERILE IRR 1000ML POUR (IV SOLUTION) ×1 IMPLANT

## 2021-04-23 NOTE — Anesthesia Procedure Notes (Signed)
Procedure Name: Intubation Date/Time: 04/23/2021 9:31 AM Performed by: Biagio Borg, CRNA Pre-anesthesia Checklist: Patient identified, Emergency Drugs available, Suction available and Patient being monitored Patient Re-evaluated:Patient Re-evaluated prior to induction Oxygen Delivery Method: Circle system utilized Preoxygenation: Pre-oxygenation with 100% oxygen Induction Type: IV induction and Rapid sequence Laryngoscope Size: McGraph and 3 Grade View: Grade I Tube type: Oral Tube size: 6.5 mm Number of attempts: 1 Airway Equipment and Method: Stylet Placement Confirmation: ETT inserted through vocal cords under direct vision, positive ETCO2 and breath sounds checked- equal and bilateral Tube secured with: Tape Dental Injury: Teeth and Oropharynx as per pre-operative assessment

## 2021-04-23 NOTE — ED Notes (Signed)
Critical result called: covid positive. Provider at bedside, notified at this time.

## 2021-04-23 NOTE — ED Notes (Signed)
Informed consent signed at this time and placed at bedside.

## 2021-04-23 NOTE — ED Notes (Signed)
Patient transported to CT 

## 2021-04-23 NOTE — Progress Notes (Signed)
Verified with MD that patient does not have to be on airborne/contact precautions due to positive covid test results.

## 2021-04-23 NOTE — ED Notes (Signed)
Patient aware that we need urine sample for testing, unable at this time. Pt given instruction on providing urine sample when able to do so. (pt states she urinated while waiting, but did not collect a sample)

## 2021-04-23 NOTE — Op Note (Signed)
Preoperative diagnosis: incisional hernia, incarcerated  Postoperative diagnosis: same  Procedure:  Open incisional hernia repair with mesh  Anesthesia: LMA  Surgeon: Benjamine Sprague Assistant: Windell Moment for exposure and mesh placement  Wound Classification: Clean  Specimen: none  Complications: None  Estimated Blood Loss: minimal  Indications:see HPI  Findings: 15cm x 13.8cm incisional hernia 4. Tension free repair achieved with mesh and suture 5. Adequate hemostasis  Description of procedure: The patient was brought to the operating room and general anesthesia was induced. A time-out was completed verifying correct patient, procedure, site, positioning, and implant(s) and/or special equipment prior to beginning this procedure. Antibiotics were administered prior to making the incision. SCDs placed. The anterior abdominal wall was prepped and draped in the standard sterile fashion.   An infraumbilical incision was made.  2 large hernia sacs were noted within the subcutaneous tissue immediately deep to this incision.  The sac was meticulously dissected off the surrounding tissue down to the fascial defect.  One of the hernia sacs noted to actually have hernia contents from 2 small defects combined into 1.  The sac was opened and hernia contents were all reduced back into the abdominal cavity.  Contents included small bowel and right colon as noted on the CT scan, and the point of obstruction on the small bowel was easily identified with chronic scar tissue noted to fold the bowel onto itself.  The scar tissue was transected and the small bowel was released.  No other injury to the bowels were noted.  Excess omentum that was adherently stuck to the redundant hernia sac was transected with ensure at this point in the procedure.  The small hernia defects were adjacent to each other so there bridging fascia was all transected in order to create 1 large hernia.  This measured a total of 15 cm x  13.8 cm in the end.    A 20 x 25 cm Bard ventral light ST mesh was placed within the peritoneal cavity through the present defect and secured in place on the abdominal wall using interrupted 1 PDS suture circumferentially.  Once the mesh was noted to be laying flat and secured to the abdominal wall the defect itself, with no room in between sutures for bowel contents herniating through, the defect itself was primary closed using 1 PDS x2, under minimal tension.  Excess hernia sac contents were resected at this point, but some was left in order to allow proper closure and extra reinforcement of the hernia defect.  This resulted in a small palpable lump at the incision site.  The fascia as well as the skin incision was then infused with Zynrelef.  [place] drains were then placed in the former hernia cavity sites, secured to the skin using 3-0 nylon.  The wound was irrigated and closed in a multilayer fashion, using 3-0 Vicryl for the deep dermal layer in an interrupted fashion and staples.  wound was then dressed with honeycomb dressing.  Patient was then successfully awakened and transferred to PACU in stable condition.  At the end of the procedure sponge and instrument counts were correct

## 2021-04-23 NOTE — ED Provider Notes (Signed)
[facility] Emergency Department Provider Note  ____________________________________________   Event Date/Time   First MD Initiated Contact with Patient 04/23/21 [id number]     (approximate)  I have reviewed the triage vital signs and the nursing notes.   HISTORY  Chief Complaint Abdominal Pain, Emesis, and Dizziness    HPI Kathleen Glenn is a 75 y.o. female with history of hypertension, endometrial cancer status post total hysterectomy with colovesical fistula and a rectovaginal fistula status post colon resection who presents to the emergency department complaints of intermittent shooting, sharp diffuse abdominal pain with 3 episodes of nonbloody, nonbilious vomiting that started yesterday.  During these episodes she will become diaphoretic and she has felt lightheaded.  No chest pain, shortness of breath.  No fevers.  No dysuria, hematuria, vaginal bleeding or discharge.  No diarrhea.  Has been able to pass gas.  States that she was told that her fistulas were secondary to complications from her hysterectomy.  She has no known history of Crohn's disease or ulcerative colitis.  Has a lower abdominal hernia due to previous surgeries.  No history of bowel obstruction.      Past Medical History:  Diagnosis Date   Atypical mole 06/23/2020   L post arm above elbow, excised 08/17/20   Basal cell carcinoma 02/06/2017   Left medial breast. Superficial.    Basal cell carcinoma 02/06/2017   Right chin. Nodular pattern.   Endometrial cancer ([place]) 2013   Hypertension     Patient Active Problem List   Diagnosis Date Noted   Colo-vesical fistula 10/07/2015   Rectovaginal fistula 07/14/2015   Vaginal anomaly 07/09/2015   Acquired deformity of finger 12/25/2014   Primary cancer of endometrium ([place]) 12/25/2014   Blood glucose elevated 12/25/2014   Adult BMI 30+ 12/25/2014   Hemorrhage, postmenopausal 12/25/2014   Basal cell papilloma 12/25/2014   Leg varices  12/25/2014   Essential hypertension 05/03/2013   Abnormal EKG 05/03/2013   Malignant neoplasm of corpus uteri, except isthmus ([place]) 03/29/2012    Past Surgical History:  Procedure Laterality Date   ABDOMINAL HYSTERECTOMY  2013   BSO, Tumor Stage 1 Cancer   BILATERAL SALPINGOOPHORECTOMY  2013   CATARACT EXTRACTION  2005   CERVICAL POLYPECTOMY  08/2011   Dr. Avelina Laine   ENDOMETRIAL BIOPSY  08/15/2011   Dr. Avelina Laine;    HYSTEROSCOPY  08/2011   Dr. Avelina Laine    Prior to Admission medications   Medication Sig Start Date End Date Taking? Authorizing Provider  Biotin 1 MG CAPS Take by mouth daily.     [provider]  Calcium Carbonate-Vit D-Min (CALCIUM 600+D PLUS MINERALS) 600-400 MG-UNIT TABS Take by mouth.    [provider]  magnesium oxide (MAG-OX) 400 MG tablet Take 400 mg by mouth daily.    [provider]  Multiple Vitamin (MULTIVITAMIN) capsule Take by mouth.    [provider]  Multiple Vitamins-Minerals (PRESERVISION AREDS 2 PO) Take by mouth.    [provider]  RA TURMERIC PO Take by mouth daily.    [provider]  triamterene-hydrochlorothiazide (MAXZIDE-25) 37.5-25 MG tablet Take 1 tablet by mouth daily. 10/02/20   Mar Daring, PA-C    Allergies Diclofenac sodium  Family History  Problem Relation Age of Onset   Arthritis Father    Diabetes Father        "borderline"   Hypertension Father    Congestive Heart Failure Father    Transient ischemic attack Father  Throat cancer Father    Skin cancer Sister    Diabetes Sister    Thyroid nodules Sister    Skin cancer Brother    Transient ischemic attack Mother    Skin cancer Sister    Breast cancer Neg Hx     Social History Social History   Tobacco Use   Smoking status: Never   Smokeless tobacco: Never  Vaping Use   Vaping Use: Never used  Substance Use Topics   Alcohol use: Yes    Comment: occasional glass of wine   Drug use: No     Review of Systems Constitutional: No fever. Eyes: No visual changes. ENT: No sore throat. Cardiovascular: Denies chest pain. Respiratory: Denies shortness of breath. Gastrointestinal: + nausea, vomiting. No diarrhea. Genitourinary: Negative for dysuria. Musculoskeletal: Negative for back pain. Skin: Negative for rash. Neurological: Negative for focal weakness or numbness.  ____________________________________________   PHYSICAL EXAM:  VITAL SIGNS: ED Triage Vitals  Enc Vitals Group     BP 04/22/21 2132 (!) 164/104     Pulse Rate 04/22/21 2132 89     Resp 04/22/21 2132 18     Temp 04/22/21 2132 98.4 F (36.9 C)     Temp Source 04/22/21 2132 Oral     SpO2 04/22/21 2132 100 %     Weight 04/22/21 2133 200 lb (90.7 kg)     Height 04/22/21 2133 '5\' 3"'$  (1.6 m)     Head Circumference --      Peak Flow --      Pain Score 04/22/21 2133 5     Pain Loc --      Pain Edu? --      Excl. in [place]? --    CONSTITUTIONAL: Alert and oriented and responds appropriately to questions.  Elderly, afebrile HEAD: Normocephalic EYES: Conjunctivae clear, pupils appear equal, EOM appear intact ENT: normal nose; moist mucous membranes NECK: Supple, normal ROM CARD: RRR; S1 and S2 appreciated; no murmurs, no clicks, no rubs, no gallops RESP: Normal chest excursion without splinting or tachypnea; breath sounds clear and equal bilaterally; no wheezes, no rhonchi, no rales, no hypoxia or respiratory distress, speaking full sentences ABD/GI: Hyperactive bowel sounds, non-distended but exam limited due to obesity; soft, diffusely tender throughout the abdomen, I am able to appreciate a hernia in the lower abdomen that I am able to reduce but she is tender to palpation of this area.  There are no overlying skin changes. BACK: The back appears normal EXT: Normal ROM in all joints; no deformity noted, no edema; no cyanosis SKIN: Normal color for age and race; warm; no rash on exposed skin NEURO: Moves all  extremities equally PSYCH: The patient's mood and manner are appropriate.  ____________________________________________   LABS (all labs ordered are listed, but only abnormal results are displayed)  Labs Reviewed  COMPREHENSIVE METABOLIC PANEL - Abnormal; Notable for the following components:      Result Value   Chloride 95 (*)    Glucose, Bld 169 (*)    All other components within normal limits  CBC - Abnormal; Notable for the following components:   WBC 16.9 (*)    All other components within normal limits  URINALYSIS, COMPLETE (UACMP) WITH MICROSCOPIC - Abnormal; Notable for the following components:   Color, Urine YELLOW (*)    APPearance CLOUDY (*)    Ketones, ur 5 (*)    Leukocytes,Ua LARGE (*)    Bacteria, UA MANY (*)    All other components within  normal limits  URINE CULTURE  RESP PANEL BY RT-PCR (FLU A&B, COVID) ARPGX2  LIPASE, BLOOD  TROPONIN I (HIGH SENSITIVITY)   ____________________________________________  EKG   EKG Interpretation  Date/Time:  Thursday April 22 2021 21:37:30 EDT Ventricular Rate:  85 PR Interval:  164 QRS Duration: 92 QT Interval:  378 QTC Calculation: 449 R Axis:   -18 Text Interpretation: Normal sinus rhythm Moderate voltage criteria for LVH, may be normal variant ( R in aVL , Cornell product ) Borderline ECG Confirmed by Pryor Curia [phone]) on 04/23/2021 4:45:27 AM        ____________________________________________  RADIOLOGY Jessie Foot Alexsandra Shontz, personally viewed and evaluated these images (plain radiographs) as part of my medical decision making, as well as reviewing the written report by the radiologist.  ED MD interpretation: CT imaging shows partial bowel obstruction related to patient's large bilateral paracentral ventral hernias.  Official radiology report(s): CT ABDOMEN PELVIS W CONTRAST  Result Date: 04/23/2021 CLINICAL DATA:  Abdominal pain, acute nonlocalized. Vomiting and dizziness with left lower quadrant pain  EXAM: CT ABDOMEN AND PELVIS WITH CONTRAST TECHNIQUE: Multidetector CT imaging of the abdomen and pelvis was performed using the standard protocol following bolus administration of intravenous contrast. CONTRAST:  4m OMNIPAQUE IOHEXOL 350 MG/ML SOLN COMPARISON:  None. FINDINGS: Lower chest:  No acute finding Hepatobiliary: No focal liver abnormality.High-density gallbladder contents suggesting cholelithiasis. No evidence of inflammation. Pancreas: Generalized atrophy Spleen: Unremarkable. Adrenals/Urinary Tract: Negative adrenals. Exophytic fatty and minimally soft tissue density mass from the right kidney measuring up to 8.2 cm. No hydronephrosis. Unremarkable bladder. Stomach/Bowel: Mildly dilated and fluid-filled small bowel loops above ventral hernias which contains small and large bowel. There are 2 discrete hernia sacs which are bilateral paracentral from the midline, on the left containing the ileocecal valve and appendix. No signs of bowel perforation or necrosis. Extensive colonic diverticulosis. Vascular/Lymphatic: Atheromatous calcification. No mass or adenopathy. Reproductive:Hysterectomy Other: No ascites or pneumoperitoneum. Musculoskeletal: Generalized spinal degeneration with L4-5 and L5 anterolisthesis. Multilevel spinal stenosis. IMPRESSION: 1. Partial small bowel obstruction associated with large bilateral paracentral ventral hernias which contain small and large bowel loops. 2. 8 cm right renal angiomyolipoma. 3. Suspect cholelithiasis. Electronically Signed   By: JMonte FantasiaM.D.   On: 04/23/2021 06:32    ____________________________________________   PROCEDURES  Procedure(s) performed (including Critical Care):  Procedures    ____________________________________________   INITIAL IMPRESSION / ASSESSMENT AND PLAN / ED COURSE  As part of my medical decision making, I reviewed the following data [place] the [place] obtained from family, Nursing  notes reviewed and incorporated, Labs reviewed , Old chart reviewed, A consult was requested and obtained from this/these consultant(s) SURGERY, CT reviewed, Notes from prior ED visits, and  Controlled Substance Database         Patient here with complaints of abdominal pain, vomiting.  Differential includes bowel obstruction, gastritis, viral illness, cholecystitis, pancreatitis, UTI, pyonephritis, kidney stone, colitis, diverticulitis, appendicitis.  Labs show leukocytosis of 16,000 but otherwise unremarkable.  EKG nonischemic.  She declines any pain or nausea medicine at this time.  Will obtain CT of the abdomen pelvis, urinalysis for further evaluation.  ED PROGRESS  6:40 AM  CT concerning for partial bowel obstruction due to hernias.  I did discuss the case with Dr. SLysle Pearlon for general surgery who agrees with placement of NG tube and will admit patient.  We will keep her n.p.o. and continue IV fluids.  She states she is not having significant  pain or nausea at this time.  She also appears to have a possible urinary tract infection versus contamination.  We will send urine culture and give Rocephin.   I reviewed all nursing notes and pertinent previous records as available.  I have reviewed and interpreted any EKGs, lab and urine results, imaging (as available).  ____________________________________________   FINAL CLINICAL IMPRESSION(S) / ED DIAGNOSES  Final diagnoses:  Generalized abdominal pain  Nausea and vomiting, intractability of vomiting not specified, unspecified vomiting type  Other partial intestinal obstruction ([place])  Acute UTI     ED Discharge Orders     None       *Please note:  Kathleen Glenn was evaluated in Emergency Department on 04/23/2021 for the symptoms described in the history of present illness. She was evaluated in the context of the global COVID-19 pandemic, which necessitated consideration that the patient might be at risk for infection with the  SARS-CoV-2 virus that causes COVID-19. Institutional protocols and algorithms that pertain to the evaluation of patients at risk for COVID-19 are in a state of rapid change based on information released by regulatory bodies including the CDC and federal and state organizations. These policies and algorithms were followed during the patient's care in the ED.  Some ED evaluations and interventions may be delayed as a result of limited staffing during and the pandemic.*   Note:  This document was prepared using Dragon voice recognition software and may include unintentional dictation errors.    Yailine Ballard, Delice Bison, DO 04/23/21 [phone]

## 2021-04-23 NOTE — H&P (Signed)
Subjective:   CC: ventral hernia  HPI:  Kathleen Glenn is a 75 y.o. female who was referred by Ward for evaluation of above cc.   Symptoms were first noted several hours ago. Pain is sharp, constant, worse it has ever been.  Associated with N/V, exacerbated by touch  Lump is not reducible.   Hx of episodes similar in past, this was is worst.  Not seen surgeon prior to discuss repair of this chronic incisional hernia    Past Medical History:  has a past medical history of Atypical mole (06/23/2020), Basal cell carcinoma (02/06/2017), Basal cell carcinoma (02/06/2017), Endometrial cancer ([place]) (2013), and Hypertension.  Past Surgical History:  Past Surgical History:  Procedure Laterality Date   ABDOMINAL HYSTERECTOMY  2013   BSO, Tumor Stage 1 Cancer   BILATERAL SALPINGOOPHORECTOMY  2013   CATARACT EXTRACTION  2005   CERVICAL POLYPECTOMY  08/2011   Dr. Avelina Laine   ENDOMETRIAL BIOPSY  08/15/2011   Dr. Avelina Laine;    HYSTEROSCOPY  08/2011   Dr. Avelina Laine    Family History: family history includes Arthritis in her father; Congestive Heart Failure in her father; Diabetes in her father and sister; Hypertension in her father; Skin cancer in her brother, sister, and sister; Throat cancer in her father; Thyroid nodules in her sister; Transient ischemic attack in her father and mother.  Social History:  reports that she has never smoked. She has never used smokeless tobacco. She reports current alcohol use. She reports that she does not use drugs.  Current Medications:  Prior to Admission medications   Medication Sig Start Date End Date Taking? Authorizing Provider  Biotin 1 MG CAPS Take by mouth daily.     [provider]  Calcium Carbonate-Vit D-Min (CALCIUM 600+D PLUS MINERALS) 600-400 MG-UNIT TABS Take by mouth.    [provider]  magnesium oxide (MAG-OX) 400 MG tablet Take 400 mg by mouth daily.    [provider]  Multiple Vitamin (MULTIVITAMIN)  capsule Take by mouth.    [provider]  Multiple Vitamins-Minerals (PRESERVISION AREDS 2 PO) Take by mouth.    [provider]  RA TURMERIC PO Take by mouth daily.    [provider]  triamterene-hydrochlorothiazide (MAXZIDE-25) 37.5-25 MG tablet Take 1 tablet by mouth daily. 10/02/20   Mar Daring, PA-C    Allergies:  Allergies as of 04/22/2021 - Review Complete 04/22/2021  Allergen Reaction Noted   Diclofenac sodium Swelling 05/03/2013    ROS:  General: Denies weight loss, weight gain, fatigue, fevers, chills, and night sweats. Eyes: Denies blurry vision, double vision, eye pain, itchy eyes, and tearing. Ears: Denies hearing loss, earache, and ringing in ears. Nose: Denies sinus pain, congestion, infections, runny nose, and nosebleeds. Mouth/throat: Denies hoarseness, sore throat, bleeding gums, and difficulty swallowing. Heart: Denies chest pain, palpitations, racing heart, irregular heartbeat, leg pain or swelling, and decreased activity tolerance. Respiratory: Denies breathing difficulty, shortness of breath, wheezing, cough, and sputum. GI: Denies change in appetite, heartburn, nausea, vomiting, constipation, diarrhea, and blood in stool. GU: Denies difficulty urinating, pain with urinating, urgency, frequency, blood in urine. Musculoskeletal: Denies joint stiffness, pain, swelling, muscle weakness. Skin: Denies rash, itching, mass, tumors, sores, and boils Neurologic: Denies headache, fainting, dizziness, seizures, numbness, and tingling. Psychiatric: Denies depression, anxiety, difficulty sleeping, and memory loss. Endocrine: Denies heat or cold intolerance, and increased thirst or urination. Blood/lymph: Denies easy bruising, easy bruising, and swollen glands     Objective:     BP  129/84   Pulse (!) 105   Temp 97.8 F (36.6 C) (Oral)   Resp (!) 30   Ht '5\' 3"'$  (1.6 m)   Wt 90.7 kg   SpO2 100%   BMI 35.43 kg/m   Constitutional :   alert, cooperative, and appears stated age  Lymphatics/Throat:  no asymmetry, masses, or scars  Respiratory:  clear to auscultation bilaterally  Cardiovascular:  regular rate and rhythm  Gastrointestinal: Soft, focal guarding over large incisional hernia, with more severe point TTP in area of concern on CT .  No overlying skin changes. NG with bilious output  Musculoskeletal: lying in bed, no obvious difficulty moving upper extremities  Skin: Cool and moist  Psychiatric: Normal affect, non-agitated, not confused       LABS:  CMP Latest Ref Rng & Units 04/22/2021 01/01/2018 10/25/2016  Glucose 70 - 99 mg/dL 169(H) 95 102(H)  BUN 8 - 23 mg/dL '23 19 23  '$ Creatinine 0.44 - 1.00 mg/dL 0.90 0.90 0.96  Sodium 135 - 145 mmol/L 135 137 137  Potassium 3.5 - 5.1 mmol/L 3.6 3.7 4.0  Chloride 98 - 111 mmol/L 95(L) 99 94(L)  CO2 22 - 32 mmol/L '28 22 23  '$ Calcium 8.9 - 10.3 mg/dL 9.6 9.2 9.3  Total Protein 6.5 - 8.1 g/dL 8.0 7.0 7.3  Total Bilirubin 0.3 - 1.2 mg/dL 0.9 0.3 0.3  Alkaline Phos 38 - 126 U/L 74 61 68  AST 15 - 41 U/L '17 17 16  '$ ALT 0 - 44 U/L '13 10 14   '$ CBC Latest Ref Rng & Units 04/22/2021 01/01/2018 10/25/2016  WBC 4.0 - 10.5 K/uL 16.9(H) 8.7 7.5  Hemoglobin 12.0 - 15.0 g/dL 14.8 13.1 13.7  Hematocrit 36.0 - 46.0 % 43.9 38.3 41.4  Platelets 150 - 400 K/uL 325 324 313    RADS: CLINICAL DATA:  Abdominal pain, acute nonlocalized. Vomiting and dizziness with left lower quadrant pain   EXAM: CT ABDOMEN AND PELVIS WITH CONTRAST   TECHNIQUE: Multidetector CT imaging of the abdomen and pelvis was performed using the standard protocol following bolus administration of intravenous contrast.   CONTRAST:  40m OMNIPAQUE IOHEXOL 350 MG/ML SOLN   COMPARISON:  None.   FINDINGS: Lower chest:  No acute finding   Hepatobiliary: No focal liver abnormality.High-density gallbladder contents suggesting cholelithiasis. No evidence of inflammation.   Pancreas: Generalized atrophy   Spleen:  Unremarkable.   Adrenals/Urinary Tract: Negative adrenals. Exophytic fatty and minimally soft tissue density mass from the right kidney measuring up to 8.2 cm. No hydronephrosis. Unremarkable bladder.   Stomach/Bowel: Mildly dilated and fluid-filled small bowel loops above ventral hernias which contains small and large bowel. There are 2 discrete hernia sacs which are bilateral paracentral from the midline, on the left containing the ileocecal valve and appendix. No signs of bowel perforation or necrosis. Extensive colonic diverticulosis.   Vascular/Lymphatic: Atheromatous calcification. No mass or adenopathy.   Reproductive:Hysterectomy   Other: No ascites or pneumoperitoneum.   Musculoskeletal: Generalized spinal degeneration with L4-5 and L5 anterolisthesis. Multilevel spinal stenosis.   IMPRESSION: 1. Partial small bowel obstruction associated with large bilateral paracentral ventral hernias which contain small and large bowel loops. 2. 8 cm right renal angiomyolipoma. 3. Suspect cholelithiasis.     Electronically Signed   By: JMonte FantasiaM.D.   On: 04/23/2021 06:32 Assessment:   Incisional hernia-. CT reviewed by myself and agree with above findings. Plan:   Chronic and complex.  Although CT did not show obvious signs of  strangulation, degree of TTP, along with persistent tachycardia and leukocytosis concerning for likelihood of failure of conservative management.  One partciular area of concern noted on CT when I personally reviewed it where the defect is smaller than other portions, making progression to strangulation concerning as well if we continue with non-operative intervention.  Therefore, recommended proceeding with surgery.   Discussed the risk of surgery including recurrence, which can be up to 50% in the case of incisional or complex hernias, possible use of prosthetic materials (mesh) and the increased risk of mesh infxn if used, bleeding, chronic pain,  post-op infxn, post-op SBO or ileus, and possible re-operation to address said risks. The risks of general anesthetic, if used, includes MI, CVA, sudden death or even reaction to anesthetic medications also discussed. Alternatives include continued observation.  Benefits include possible symptom relief, prevention of incarceration, strangulation, enlargement in size over time, and the risk of emergency surgery in the face of strangulation.  Typical post-op recovery time of 3-5 days with 4-6 weeks of activity restrictions were also discussed.  The patient and family members all verbalized understanding and all questions were answered to the patient's satisfaction. Agree to proceed with emergent repair, understanding the chance of recurrence is even higher in these situations.    Recent COVID infection and test still showing positive.  Asymptomatic and had minimal symptoms 6weeks ago, so will proceed as though no active infection.

## 2021-04-23 NOTE — ED Notes (Signed)
Pt to preop at this time

## 2021-04-23 NOTE — Transfer of Care (Signed)
Immediate Anesthesia Transfer of Care Note  Patient: Kathleen Glenn  Procedure(s) Performed: HERNIA REPAIR VENTRAL ADULT INSERTION OF MESH  Patient Location: PACU  Anesthesia Type:General  Level of Consciousness: awake  Airway & Oxygen Therapy: Patient Spontanous Breathing and Patient connected to face mask oxygen  Post-op Assessment: Report given to RN and Post -op Vital signs reviewed and stable  Post vital signs: Reviewed and stable  Last Vitals:  Vitals Value Taken Time  BP 137/73 04/23/21 1300  Temp 37.3 C 04/23/21 1300  Pulse 84 04/23/21 1305  Resp 19 04/23/21 1305  SpO2 99 % 04/23/21 1305  Vitals shown include unvalidated device data.  Last Pain:  Vitals:   04/23/21 0856  TempSrc: Temporal  PainSc: 8      C/o abd pain. Additional   hydromorphone given    Complications: No notable events documented.

## 2021-04-23 NOTE — Anesthesia Preprocedure Evaluation (Signed)
Anesthesia Evaluation  Patient identified by MRN, date of birth, ID band Patient awake    Reviewed: Allergy & Precautions, NPO status , Patient's Chart, lab work & pertinent test results  Airway Mallampati: II  TM Distance: >3 FB Neck ROM: Full    Dental no notable dental hx.    Pulmonary neg pulmonary ROS,    Pulmonary exam normal        Cardiovascular hypertension, Pt. on medications negative cardio ROS Normal cardiovascular exam     Neuro/Psych negative neurological ROS  negative psych ROS   GI/Hepatic negative GI ROS, Neg liver ROS,   Endo/Other  negative endocrine ROS  Renal/GU negative Renal ROS  negative genitourinary   Musculoskeletal negative musculoskeletal ROS (+)   Abdominal   Peds negative pediatric ROS (+)  Hematology negative hematology ROS (+)   Anesthesia Other Findings . Colo-vesical fistula 10/07/2015 . Rectovaginal fistula 07/14/2015 . Vaginal anomaly 07/09/2015 . Acquired deformity of finger 12/25/2014 . Primary cancer of endometrium ([place]) 12/25/2014 . Blood glucose elevated 12/25/2014 . Adult BMI 30+ 12/25/2014 . Hemorrhage, postmenopausal 12/25/2014 . Basal cell papilloma 12/25/2014 . Leg varices 12/25/2014 . Essential hypertension 05/03/2013 . Abnormal EKG 05/03/2013 . Malignant neoplasm of corpus uteri, except isthmus ([facility]) 03/29/2012    Reproductive/Obstetrics negative OB ROS                            Anesthesia Physical Anesthesia Plan  ASA: 4 and emergent  Anesthesia Plan: General   Post-op Pain Management:    Induction: Intravenous, Rapid sequence and Cricoid pressure planned  PONV Risk Score and Plan: 3 and Midazolam and Propofol infusion  Airway Management Planned: Oral ETT  Additional Equipment:   Intra-op Plan:   Post-operative Plan: Extubation in OR  Informed Consent: I have reviewed the patients History and Physical, chart, labs  and discussed the procedure including the risks, benefits and alternatives for the proposed anesthesia with the patient or authorized representative who has indicated his/her understanding and acceptance.       Plan Discussed with: CRNA and Surgeon  Anesthesia Plan Comments:         Anesthesia Quick Evaluation

## 2021-04-23 NOTE — Anesthesia Postprocedure Evaluation (Signed)
Anesthesia Post Note  Patient: Kathleen Glenn  Procedure(s) Performed: HERNIA REPAIR VENTRAL ADULT INSERTION OF MESH  Patient location during evaluation: PACU Anesthesia Type: General Level of consciousness: awake and alert, awake and oriented Pain management: pain level controlled Vital Signs Assessment: post-procedure vital signs reviewed and stable Respiratory status: spontaneous breathing, nonlabored ventilation and respiratory function stable Cardiovascular status: blood pressure returned to baseline and stable Postop Assessment: no apparent nausea or vomiting Anesthetic complications: no   No notable events documented.   Last Vitals:  Vitals:   04/23/21 1300 04/23/21 1315  BP: 137/73 (!) 147/80  Pulse: 81 85  Resp: 16 19  Temp: 37.3 C   SpO2: 99% 100%    Last Pain:  Vitals:   04/23/21 1330  TempSrc:   PainSc: 8                  Phill Mutter

## 2021-04-24 LAB — CBC
HCT: 36.3 % (ref 36.0–46.0)
Hemoglobin: 12 g/dL (ref 12.0–15.0)
MCH: 31.7 pg (ref 26.0–34.0)
MCHC: 33.1 g/dL (ref 30.0–36.0)
MCV: 96 fL (ref 80.0–100.0)
Platelets: 270 10*3/uL (ref 150–400)
RBC: 3.78 MIL/uL — ABNORMAL LOW (ref 3.87–5.11)
RDW: 14.4 % (ref 11.5–15.5)
WBC: 9.7 10*3/uL (ref 4.0–10.5)
nRBC: 0 % (ref 0.0–0.2)

## 2021-04-24 LAB — BASIC METABOLIC PANEL
Anion gap: 9 (ref 5–15)
BUN: 22 mg/dL (ref 8–23)
CO2: 26 mmol/L (ref 22–32)
Calcium: 8 mg/dL — ABNORMAL LOW (ref 8.9–10.3)
Chloride: 103 mmol/L (ref 98–111)
Creatinine, Ser: 0.77 mg/dL (ref 0.44–1.00)
GFR, Estimated: >60 mL/min (ref >60)
Glucose, Bld: 130 mg/dL — ABNORMAL HIGH (ref 70–99)
Potassium: 3.5 mmol/L (ref 3.5–5.1)
Sodium: 138 mmol/L (ref 135–145)

## 2021-04-24 MED ORDER — MORPHINE SULFATE (PF) 2 MG/ML IV SOLN
1.0000 mg | INTRAVENOUS | Status: DC | PRN
  Administered 2021-04-24: 1 mg via INTRAVENOUS
  Filled 2021-04-24: qty 1

## 2021-04-24 MED ORDER — HYDROMORPHONE HCL 1 MG/ML IJ SOLN
0.5000 mg | INTRAMUSCULAR | Status: DC | PRN
  Administered 2021-04-25: 0.5 mg via INTRAVENOUS
  Filled 2021-04-24: qty 0.5

## 2021-04-24 NOTE — Progress Notes (Signed)
1 Day Post-Op Procedure(s) (LRB): HERNIA REPAIR VENTRAL ADULT (N/A) INSERTION OF MESH (N/A) Subjective: She is complaining of incisional pain, worse with movement and is very concerned about needing to get up to urinate and for mobility.  Foley catheter was removed this morning.  She is not passing flatus.  Objective: Vital signs in last 24 hours: Temp:  [98.1 F (36.7 C)-99.2 F (37.3 C)] 98.6 F (37 C) (08/20 0745) Pulse Rate:  [69-100] 69 (08/20 0745) Cardiac Rhythm: Normal sinus rhythm (08/19 1300) Resp:  [16-19] 18 (08/20 0745) BP: (129-147)/(67-80) 132/67 (08/20 0745) SpO2:  [93 %-100 %] 93 % (08/20 0745)  Hemodynamic parameters for last 24 hours:    Intake/Output from previous day: 08/19 0701 - 08/20 0700 In: 2438.9 [I.V.:2438.9] Out: 900 [Urine:710; Emesis/NG output:50; Drains:40; Blood:100] Intake/Output this shift: No intake/output data recorded.  General appearance: alert and uncomfortable appearing.  NG tube in place Heart: regular rate and rhythm Lungs: Normal work of breathing on room air Abdomen: Soft, appropriately tender given postoperative day 1.  Honeycomb dressing in place.  Bilateral drains with old blood and serous fluid present. Wound: No erythema, induration, or drainage.  Lab Results: Recent Labs    04/22/21 2136 04/24/21 0512  WBC 16.9* 9.7  HGB 14.8 12.0  HCT 43.9 36.3  PLT 325 270   BMET:  Recent Labs    04/22/21 2136 04/24/21 0512  NA 135 138  K 3.6 3.5  CL 95* 103  CO2 28 26  GLUCOSE 169* 130*  BUN 23 22  CREATININE 0.90 0.77  CALCIUM 9.6 8.0*    PT/INR: No results for input(s): LABPROT, INR in the last 72 hours. ABG No results found for: PHART, HCO3, TCO2, ACIDBASEDEF, O2SAT CBG (last 3)  No results for input(s): GLUCAP in the last 72 hours.  Assessment/Plan: S/P Procedure(s) (LRB): HERNIA REPAIR VENTRAL ADULT (N/A) INSERTION OF MESH (N/A) Mobilize We will work on improving pain control so that she is able to get out  of bed and work with therapies.  Continue n.p.o. with NG tube in place.  Foley removed, but patient is requesting pure wick device.   LOS: 1 day    Fredirick Maudlin 04/24/2021

## 2021-04-25 LAB — BASIC METABOLIC PANEL
Anion gap: 8 (ref 5–15)
BUN: 19 mg/dL (ref 8–23)
CO2: 25 mmol/L (ref 22–32)
Calcium: 8 mg/dL — ABNORMAL LOW (ref 8.9–10.3)
Chloride: 105 mmol/L (ref 98–111)
Creatinine, Ser: 0.82 mg/dL (ref 0.44–1.00)
GFR, Estimated: >60 mL/min (ref >60)
Glucose, Bld: 105 mg/dL — ABNORMAL HIGH (ref 70–99)
Potassium: 3.9 mmol/L (ref 3.5–5.1)
Sodium: 138 mmol/L (ref 135–145)

## 2021-04-25 LAB — CBC
HCT: 33.6 % — ABNORMAL LOW (ref 36.0–46.0)
Hemoglobin: 10.8 g/dL — ABNORMAL LOW (ref 12.0–15.0)
MCH: 32.1 pg (ref 26.0–34.0)
MCHC: 32.1 g/dL (ref 30.0–36.0)
MCV: 100 fL (ref 80.0–100.0)
Platelets: 251 10*3/uL (ref 150–400)
RBC: 3.36 MIL/uL — ABNORMAL LOW (ref 3.87–5.11)
RDW: 14.3 % (ref 11.5–15.5)
WBC: 11.7 10*3/uL — ABNORMAL HIGH (ref 4.0–10.5)
nRBC: 0 % (ref 0.0–0.2)

## 2021-04-25 LAB — URINE CULTURE: Culture: >=100000 — AB

## 2021-04-25 NOTE — Progress Notes (Signed)
2 Days Post-Op Procedure(s) (LRB): HERNIA REPAIR VENTRAL ADULT (N/A) INSERTION OF MESH (N/A) Subjective: Incisional pain somewhat improved. Her NGT was inadvertently pulled out yesterday. No nausea. No flatus.  Objective: Vital signs in last 24 hours: Temp:  [97.8 F (36.6 C)-98.8 F (37.1 C)] 98.8 F (37.1 C) (08/21 0755) Pulse Rate:  [95-99] 95 (08/21 0755) Resp:  [16-20] 16 (08/21 0755) BP: (133-140)/(67-78) 133/71 (08/21 0755) SpO2:  [96 %] 96 % (08/21 0755)  Hemodynamic parameters for last 24 hours:    Intake/Output from previous day: 08/20 0701 - 08/21 0700 In: 1706.1 [I.V.:1706.1] Out: 315 [Urine:200; Drains:115] Intake/Output this shift: No intake/output data recorded.  General appearance: alert and appears more comfortable today.  Heart: regular rate and rhythm Lungs: Normal work of breathing on room air Abdomen: Soft, appropriately tender. Honeycomb dressing in place.  Bilateral drains with old blood and serous fluid present. Wound: No erythema, induration, or drainage.  Lab Results: Recent Labs    04/24/21 0512 04/25/21 0530  WBC 9.7 11.7*  HGB 12.0 10.8*  HCT 36.3 33.6*  PLT 270 251    BMET:  Recent Labs    04/24/21 0512 04/25/21 0530  NA 138 138  K 3.5 3.9  CL 103 105  CO2 26 25  GLUCOSE 130* 105*  BUN 22 19  CREATININE 0.77 0.82  CALCIUM 8.0* 8.0*     PT/INR: No results for input(s): LABPROT, INR in the last 72 hours. ABG No results found for: PHART, HCO3, TCO2, ACIDBASEDEF, O2SAT CBG (last 3)  No results for input(s): GLUCAP in the last 72 hours.  Assessment/Plan: S/P Procedure(s) (LRB): HERNIA REPAIR VENTRAL ADULT (N/A) INSERTION OF MESH (N/A) Mobilize We will work on improving pain control so that she is able to get out of bed and work with therapies.  Will initiate clears, but will not advance until better evidence of bowel function.  Foley removed, Pure Wick in place, but patient reluctant to urinate.   LOS: 2 days     Fredirick Maudlin 04/25/2021

## 2021-04-26 ENCOUNTER — Encounter: Payer: Self-pay | Admitting: Surgery

## 2021-04-26 LAB — CBC WITH DIFFERENTIAL/PLATELET
Abs Immature Granulocytes: 0.04 10*3/uL (ref 0.00–0.07)
Basophils Absolute: 0 10*3/uL (ref 0.0–0.1)
Basophils Relative: 0 %
Eosinophils Absolute: 0.3 10*3/uL (ref 0.0–0.5)
Eosinophils Relative: 3 %
HCT: 33.2 % — ABNORMAL LOW (ref 36.0–46.0)
Hemoglobin: 11.1 g/dL — ABNORMAL LOW (ref 12.0–15.0)
Immature Granulocytes: 0 %
Lymphocytes Relative: 10 %
Lymphs Abs: 0.9 10*3/uL (ref 0.7–4.0)
MCH: 33 pg (ref 26.0–34.0)
MCHC: 33.4 g/dL (ref 30.0–36.0)
MCV: 98.8 fL (ref 80.0–100.0)
Monocytes Absolute: 0.8 10*3/uL (ref 0.1–1.0)
Monocytes Relative: 9 %
Neutro Abs: 7.5 10*3/uL (ref 1.7–7.7)
Neutrophils Relative %: 78 %
Platelets: 260 10*3/uL (ref 150–400)
RBC: 3.36 MIL/uL — ABNORMAL LOW (ref 3.87–5.11)
RDW: 13.7 % (ref 11.5–15.5)
WBC: 9.6 10*3/uL (ref 4.0–10.5)
nRBC: 0 % (ref 0.0–0.2)

## 2021-04-26 MED ORDER — ENOXAPARIN SODIUM 40 MG/0.4ML IJ SOSY: 40.0000 mg | PREFILLED_SYRINGE | INTRAMUSCULAR | Status: DC

## 2021-04-26 MED ORDER — ENSURE ENLIVE PO LIQD
237.0000 mL | Freq: Two times a day (BID) | ORAL | Status: DC
  Administered 2021-04-26 – 2021-04-28 (×5): 237 mL via ORAL

## 2021-04-26 NOTE — Plan of Care (Signed)

## 2021-04-26 NOTE — Progress Notes (Addendum)
Subjective:  CC: Kathleen Glenn is a 75 y.o. female  Hospital stay day 3, 3 Days Post-Op incarcerated incisional hernia repair  HPI: No issues overnight.  Pain better controlled, passing flatus now.  ROS:  General: Denies weight loss, weight gain, fatigue, fevers, chills, and night sweats. Heart: Denies chest pain, palpitations, racing heart, irregular heartbeat, leg pain or swelling, and decreased activity tolerance. Respiratory: Denies breathing difficulty, shortness of breath, wheezing, cough, and sputum. GI: Denies change in appetite, heartburn, nausea, vomiting, constipation, diarrhea, and blood in stool. GU: Denies difficulty urinating, pain with urinating, urgency, frequency, blood in urine.   Objective:   Temp:  [97.5 F (36.4 C)-98.5 F (36.9 C)] 98.2 F (36.8 C) (08/22 0753) Pulse Rate:  [77-85] 77 (08/22 0753) Resp:  [16-17] 17 (08/22 0501) BP: (132-141)/(63-74) 141/70 (08/22 0753) SpO2:  [96 %-99 %] 96 % (08/22 0753)     Height: '5\' 3"'$  (160 cm) Weight: 90.7 kg BMI (Calculated): 35.43   Intake/Output this shift:   Intake/Output Summary (Last 24 hours) at 04/26/2021 0803 Last data filed at 04/26/2021 0500 Gross per 24 hour  Intake 632.91 ml  Output 1545 ml  Net -912.09 ml    Constitutional :  alert, cooperative, appears stated age, and no distress  Respiratory:  clear to auscultation bilaterally  Cardiovascular:  regular rate and rhythm  Gastrointestinal: Soft, no guarding, minimal TTP around incision. No induratoin, erythema to indicate infection .   Skin: Cool and moist. Staples c/d/I.  JP with old bloody discharge  Psychiatric: Normal affect, non-agitated, not confused       LABS:  CMP Latest Ref Rng & Units 04/25/2021 04/24/2021 04/22/2021  Glucose 70 - 99 mg/dL 105(H) 130(H) 169(H)  BUN 8 - 23 mg/dL '19 22 23  '$ Creatinine 0.44 - 1.00 mg/dL 0.82 0.77 0.90  Sodium 135 - 145 mmol/L 138 138 135  Potassium 3.5 - 5.1 mmol/L 3.9 3.5 3.6  Chloride 98 - 111 mmol/L  105 103 95(L)  CO2 22 - 32 mmol/L '25 26 28  '$ Calcium 8.9 - 10.3 mg/dL 8.0(L) 8.0(L) 9.6  Total Protein 6.5 - 8.1 g/dL - - 8.0  Total Bilirubin 0.3 - 1.2 mg/dL - - 0.9  Alkaline Phos 38 - 126 U/L - - 74  AST 15 - 41 U/L - - 17  ALT 0 - 44 U/L - - 13   CBC Latest Ref Rng & Units 04/25/2021 04/24/2021 04/22/2021  WBC 4.0 - 10.5 K/uL 11.7(H) 9.7 16.9(H)  Hemoglobin 12.0 - 15.0 g/dL 10.8(L) 12.0 14.8  Hematocrit 36.0 - 46.0 % 33.6(L) 36.3 43.9  Platelets 150 - 400 K/uL 251 270 325    RADS: N/a Assessment:   S/p incarcerated ventral hernia repair with mesh with underlay mesh.  No signs of active bleeding.  Abdominal exam reassuring. Will monitor leukocytosis and hemoglobin with serial labs.  Clinically no obvious issues at this time.   Passing flatus so will advance to full liquid

## 2021-04-26 NOTE — Care Management Important Message (Signed)
Important Message  Patient Details  Name: Kathleen Glenn MRN: [id number] Date of Birth: 05/13/1946   Medicare Important Message Given:  Yes     [name] 04/26/2021, 11:43 AM

## 2021-04-27 LAB — BASIC METABOLIC PANEL
Anion gap: 6 (ref 5–15)
BUN: 16 mg/dL (ref 8–23)
CO2: 30 mmol/L (ref 22–32)
Calcium: 8.2 mg/dL — ABNORMAL LOW (ref 8.9–10.3)
Chloride: 99 mmol/L (ref 98–111)
Creatinine, Ser: 0.72 mg/dL (ref 0.44–1.00)
GFR, Estimated: >60 mL/min (ref >60)
Glucose, Bld: 124 mg/dL — ABNORMAL HIGH (ref 70–99)
Potassium: 3.3 mmol/L — ABNORMAL LOW (ref 3.5–5.1)
Sodium: 135 mmol/L (ref 135–145)

## 2021-04-27 LAB — CBC WITH DIFFERENTIAL/PLATELET
Abs Immature Granulocytes: 0.09 10*3/uL — ABNORMAL HIGH (ref 0.00–0.07)
Basophils Absolute: 0 10*3/uL (ref 0.0–0.1)
Basophils Relative: 0 %
Eosinophils Absolute: 0.3 10*3/uL (ref 0.0–0.5)
Eosinophils Relative: 4 %
HCT: 33 % — ABNORMAL LOW (ref 36.0–46.0)
Hemoglobin: 10.9 g/dL — ABNORMAL LOW (ref 12.0–15.0)
Immature Granulocytes: 1 %
Lymphocytes Relative: 15 %
Lymphs Abs: 1.2 10*3/uL (ref 0.7–4.0)
MCH: 31.9 pg (ref 26.0–34.0)
MCHC: 33 g/dL (ref 30.0–36.0)
MCV: 96.5 fL (ref 80.0–100.0)
Monocytes Absolute: 0.8 10*3/uL (ref 0.1–1.0)
Monocytes Relative: 10 %
Neutro Abs: 5.5 10*3/uL (ref 1.7–7.7)
Neutrophils Relative %: 70 %
Platelets: 294 10*3/uL (ref 150–400)
RBC: 3.42 MIL/uL — ABNORMAL LOW (ref 3.87–5.11)
RDW: 13.7 % (ref 11.5–15.5)
WBC: 7.9 10*3/uL (ref 4.0–10.5)
nRBC: 0 % (ref 0.0–0.2)

## 2021-04-27 LAB — PHOSPHORUS: Phosphorus: 3.2 mg/dL (ref 2.5–4.6)

## 2021-04-27 LAB — MAGNESIUM: Magnesium: 2.1 mg/dL (ref 1.7–2.4)

## 2021-04-27 NOTE — Discharge Instructions (Signed)

## 2021-04-28 MED ORDER — DOCUSATE SODIUM 100 MG PO CAPS: 100.0000 mg | ORAL_CAPSULE | Freq: Two times a day (BID) | ORAL | 0 refills | Status: AC | PRN

## 2021-04-28 MED ORDER — IBUPROFEN 800 MG PO TABS: 800.0000 mg | ORAL_TABLET | Freq: Three times a day (TID) | ORAL | 0 refills | Status: DC | PRN

## 2021-04-28 MED ORDER — ACETAMINOPHEN 325 MG PO TABS: 650.0000 mg | ORAL_TABLET | Freq: Three times a day (TID) | ORAL | 0 refills | Status: AC | PRN

## 2021-04-28 MED ORDER — HYDROCODONE-ACETAMINOPHEN 5-325 MG PO TABS: 1.0000 | ORAL_TABLET | Freq: Four times a day (QID) | ORAL | 0 refills | Status: DC | PRN

## 2021-04-28 NOTE — Plan of Care (Signed)

## 2021-04-28 NOTE — Discharge Summary (Signed)
Physician Discharge Summary  Patient ID: Kathleen Glenn MRN: [id number] DOB/AGE: 1946/05/15 75 y.o.  Admit date: 04/23/2021 Discharge date: 04/28/21  Admission Diagnoses: incarcerated incisional hernia  Discharge Diagnoses:  Same as above  Discharged Condition: good  Hospital Course: admitted for above.  Underwent urgent repair.  See op note for details.  Post op slow return of bowel function but no specific intervention needed.  At time of d/c, tolerating diet, pain controlled.  Consults: None  Discharge Exam: Blood pressure (!) 146/73, pulse 80, temperature 98 F (36.7 C), resp. rate 16, height '5\' 3"'$  (1.6 m), weight 90.7 kg, SpO2 99 %. General appearance: alert, cooperative, and no distress GI: soft, minimal TTP at staple line and hernia repair site, JP with old bloody discharge  Disposition:  Discharge disposition: 01-Home or Self Care       Discharge Instructions     Discharge patient   Complete by: As directed    Discharge disposition: 01-Home or Self Care   Discharge patient date: 04/28/2021      Allergies as of 04/28/2021       Reactions   Diclofenac Sodium Swelling   Eye drops Eye drops Eye drops        Medication List     TAKE these medications    acetaminophen 325 MG tablet Commonly known as: Tylenol Take 2 tablets (650 mg total) by mouth every 8 (eight) hours as needed for mild pain.   Biotin 1 MG Caps Take by mouth daily.   Calcium 600+D Plus Minerals 600-400 MG-UNIT Tabs Take 1 tablet by mouth daily.   docusate sodium 100 MG capsule Commonly known as: Colace Take 1 capsule (100 mg total) by mouth 2 (two) times daily as needed for up to 10 days for mild constipation.   HYDROcodone-acetaminophen 5-325 MG tablet Commonly known as: Norco Take 1 tablet by mouth every 6 (six) hours as needed for up to 6 doses for moderate pain.   ibuprofen 800 MG tablet Commonly known as: ADVIL Take 1 tablet (800 mg total) by mouth every 8 (eight) hours  as needed for mild pain or moderate pain.   magnesium oxide 400 MG tablet Commonly known as: MAG-OX Take 400 mg by mouth daily.   PRESERVISION AREDS 2 PO Take by mouth.   RA TURMERIC PO Take by mouth daily.   triamterene-hydrochlorothiazide 37.5-25 MG tablet Commonly known as: MAXZIDE-25 Take 1 tablet by mouth daily.        Follow-up Information     Jaycelyn Orrison, DO Follow up in 1 week(s).   Specialty: Surgery Why: post op hernia possible staple and drain removal Contact information: [address] [place] [address] [phone]                  Total time spent arranging discharge was >39mn. Signed: IBenjamine Sprague8/24/2022, 3:27 PM

## 2021-05-07 DIAGNOSIS — H353211 Exudative age-related macular degeneration, right eye, with active choroidal neovascularization: Secondary | ICD-10-CM | POA: Diagnosis not present

## 2021-05-31 ENCOUNTER — Telehealth: Payer: Self-pay | Admitting: Physician Assistant

## 2021-05-31 NOTE — Telephone Encounter (Signed)
Copied from [place] [id number]. Topic: Medicare AWV >> May 31, 2021  3:14 PM Cher Nakai R wrote: Reason for CRM:  No answer unable to leave a  message for patient to call back and schedule Medicare Annual Wellness Visit (AWV) by phone this week.    Last AWV:    12/03/2018  Please schedule at anytime with [facility] Advisor.  If any questions, please contact me at [phone]

## 2021-06-07 DIAGNOSIS — H353211 Exudative age-related macular degeneration, right eye, with active choroidal neovascularization: Secondary | ICD-10-CM | POA: Diagnosis not present

## 2021-06-29 ENCOUNTER — Encounter: Payer: Medicare HMO | Admitting: Dermatology

## 2021-07-13 DIAGNOSIS — H353211 Exudative age-related macular degeneration, right eye, with active choroidal neovascularization: Secondary | ICD-10-CM | POA: Diagnosis not present

## 2021-09-01 DIAGNOSIS — H353211 Exudative age-related macular degeneration, right eye, with active choroidal neovascularization: Secondary | ICD-10-CM | POA: Diagnosis not present

## 2021-09-02 DIAGNOSIS — H52223 Regular astigmatism, bilateral: Secondary | ICD-10-CM | POA: Diagnosis not present

## 2021-09-20 ENCOUNTER — Ambulatory Visit (INDEPENDENT_AMBULATORY_CARE_PROVIDER_SITE_OTHER): Payer: Medicare HMO | Admitting: Family Medicine

## 2021-09-20 ENCOUNTER — Encounter: Payer: Self-pay | Admitting: Family Medicine

## 2021-09-20 ENCOUNTER — Other Ambulatory Visit: Payer: Self-pay

## 2021-09-20 VITALS — BP 134/82 | HR 98 | Temp 98.1°F | Resp 16 | Ht 62.0 in | Wt 208.0 lb

## 2021-09-20 DIAGNOSIS — R7303 Prediabetes: Secondary | ICD-10-CM | POA: Diagnosis not present

## 2021-09-20 DIAGNOSIS — I10 Essential (primary) hypertension: Secondary | ICD-10-CM | POA: Diagnosis not present

## 2021-09-20 DIAGNOSIS — Z0000 Encounter for general adult medical examination without abnormal findings: Secondary | ICD-10-CM | POA: Diagnosis not present

## 2021-09-20 DIAGNOSIS — Z8542 Personal history of malignant neoplasm of other parts of uterus: Secondary | ICD-10-CM | POA: Diagnosis not present

## 2021-09-20 DIAGNOSIS — Z1231 Encounter for screening mammogram for malignant neoplasm of breast: Secondary | ICD-10-CM | POA: Diagnosis not present

## 2021-09-20 DIAGNOSIS — Z1211 Encounter for screening for malignant neoplasm of colon: Secondary | ICD-10-CM

## 2021-09-20 NOTE — Assessment & Plan Note (Signed)
Recommend low carb diet °Recheck A1c  °

## 2021-09-20 NOTE — Patient Instructions (Addendum)
The CDC recommends two doses of Shingrix (the shingles vaccine) separated by 2 to 6 months for adults age 75 years and older. I recommend checking with your insurance plan regarding coverage for this vaccine.    Get cocoa butter with Vit E and do scar massage regularly  Try debrox ear drops for the wax in your ears

## 2021-09-20 NOTE — Progress Notes (Signed)
Annual Wellness Visit     Patient: Kathleen Glenn, Female    DOB: 10-07-1945, 76 y.o.   MRN: [id number] Visit Date: 09/20/2021  Today's Provider: Lavon Paganini, MD   Chief Complaint  Patient presents with   Medicare Wellness   Subjective    Kathleen Glenn is a 76 y.o. female who presents today for her Annual Wellness Visit. She reports consuming a general diet. Home exercise routine includes walking 1.5 hrs per week. She generally feels well. She reports sleeping well. She does have additional problems to discuss today.   HPI Pap: 2019-Pap is normal.  Per Jenni's last note: Will repeat in 3 years. (history of uterine cancer s/p hysterectomy in 2013). h/o fistulas in 2016  Decreased hearing since having COVID in July 2022  Medications: Outpatient Medications Prior to Visit  Medication Sig   Biotin 1 MG CAPS Take by mouth daily.    Calcium Carbonate-Vit D-Min (CALCIUM 600+D PLUS MINERALS) 600-400 MG-UNIT TABS Take 1 tablet by mouth daily.   HYDROcodone-acetaminophen (NORCO) 5-325 MG tablet Take 1 tablet by mouth every 6 (six) hours as needed for up to 6 doses for moderate pain.   ibuprofen (ADVIL) 800 MG tablet Take 1 tablet (800 mg total) by mouth every 8 (eight) hours as needed for mild pain or moderate pain.   magnesium oxide (MAG-OX) 400 MG tablet Take 400 mg by mouth daily.   Multiple Vitamins-Minerals (PRESERVISION AREDS 2 PO) Take by mouth.   RA TURMERIC PO Take by mouth daily.   triamterene-hydrochlorothiazide (MAXZIDE-25) 37.5-25 MG tablet Take 1 tablet by mouth daily.   No facility-administered medications prior to visit.    Allergies  Allergen Reactions   Diclofenac Sodium Swelling    Eye drops Eye drops Eye drops    Patient Care Team: [name], PA-C as PCP - General (Physician Assistant) Brendolyn Patty, MD as Consulting Physician (Dermatology)  Review of Systems  Musculoskeletal:  Positive for arthralgias.  All other systems reviewed and  are negative.  Last CBC Lab Results  Component Value Date   WBC 7.9 04/27/2021   HGB 10.9 (L) 04/27/2021   HCT 33.0 (L) 04/27/2021   MCV 96.5 04/27/2021   MCH 31.9 04/27/2021   RDW 13.7 04/27/2021   PLT 294 42/35/3614   Last metabolic panel Lab Results  Component Value Date   GLUCOSE 124 (H) 04/27/2021   NA 135 04/27/2021   K 3.3 (L) 04/27/2021   CL 99 04/27/2021   CO2 30 04/27/2021   BUN 16 04/27/2021   CREATININE 0.72 04/27/2021   GFRNONAA >60 04/27/2021   CALCIUM 8.2 (L) 04/27/2021   PHOS 3.2 04/27/2021   PROT 8.0 04/22/2021   ALBUMIN 4.1 04/22/2021   LABGLOB 2.9 01/01/2018   AGRATIO 1.4 01/01/2018   BILITOT 0.9 04/22/2021   ALKPHOS 74 04/22/2021   AST 17 04/22/2021   ALT 13 04/22/2021   ANIONGAP 6 04/27/2021   Last lipids Lab Results  Component Value Date   CHOL 164 01/01/2018   HDL 51 01/01/2018   LDLCALC 99 01/01/2018   TRIG 71 01/01/2018   CHOLHDL 3.2 01/01/2018   Last hemoglobin A1c Lab Results  Component Value Date   HGBA1C 6.0 (H) 01/01/2018   Last thyroid functions Lab Results  Component Value Date   TSH 0.843 10/25/2016        Objective    Vitals: BP 134/82 Comment: home readings   Pulse 98    Temp 98.1 F (36.7 C) (Temporal)  Resp 16    Ht 5\' 2"  (1.575 m)    Wt 208 lb (94.3 kg)    SpO2 99%    BMI 38.04 kg/m  BP Readings from Last 3 Encounters:  09/20/21 134/82  04/28/21 (!) 146/73  10/02/20 139/79   Wt Readings from Last 3 Encounters:  09/20/21 208 lb (94.3 kg)  04/23/21 199 lb 15.3 oz (90.7 kg)  09/20/19 208 lb (94.3 kg)      Physical Exam Vitals reviewed.  Constitutional:      General: She is not in acute distress.    Appearance: Normal appearance. She is well-developed. She is not diaphoretic.  HENT:     Head: Normocephalic and atraumatic.     Right Ear: Ear canal and external ear normal. There is impacted cerumen.     Left Ear: Ear canal and external ear normal. There is impacted cerumen.     Nose: Nose normal.      Mouth/Throat:     Mouth: Mucous membranes are moist.     Pharynx: Oropharynx is clear. No oropharyngeal exudate.  Eyes:     General: No scleral icterus.    Conjunctiva/sclera: Conjunctivae normal.     Pupils: Pupils are equal, round, and reactive to light.  Neck:     Thyroid: No thyromegaly.  Cardiovascular:     Rate and Rhythm: Normal rate and regular rhythm.     Pulses: Normal pulses.     Heart sounds: Normal heart sounds. No murmur heard. Pulmonary:     Effort: Pulmonary effort is normal. No respiratory distress.     Breath sounds: Normal breath sounds. No wheezing or rales.  Abdominal:     General: There is no distension.     Palpations: Abdomen is soft.     Tenderness: There is no abdominal tenderness.  Musculoskeletal:        General: No deformity.     Cervical back: Neck supple.     Right lower leg: No edema.     Left lower leg: No edema.  Lymphadenopathy:     Cervical: No cervical adenopathy.  Skin:    General: Skin is warm and dry.     Findings: No rash.  Neurological:     Mental Status: She is alert and oriented to person, place, and time. Mental status is at baseline.     Sensory: No sensory deficit.     Motor: No weakness.     Gait: Gait normal.  Psychiatric:        Mood and Affect: Mood normal.        Behavior: Behavior normal.        Thought Content: Thought content normal.   Declines pelvic exam today - having no issues  Most recent functional status assessment: In your present state of health, do you have any difficulty performing the following activities: 09/20/2021  Hearing? Y  Comment -  Vision? Y  Difficulty concentrating or making decisions? N  Walking or climbing stairs? N  Dressing or bathing? N  Doing errands, shopping? N  Some recent data might be hidden   Most recent fall risk assessment: Fall Risk  09/20/2021  Falls in the past year? 1  Comment -  Number falls in past yr: 0  Injury with Fall? 0  Risk for fall due to : No Fall Risks   Follow up Falls evaluation completed    Most recent depression screenings: PHQ 2/9 Scores 09/20/2021 10/02/2020  PHQ - 2 Score 0 0  PHQ-  9 Score 0 -   Most recent cognitive screening: 6CIT Screen 09/20/2021  What Year? 0 points  What month? 0 points  What time? 0 points  Count back from 20 0 points  Months in reverse 0 points  Repeat phrase 6 points  Total Score 6   Most recent Audit-C alcohol use screening Alcohol Use Disorder Test (AUDIT) 09/20/2021  1. How often do you have a drink containing alcohol? 0  2. How many drinks containing alcohol do you have on a typical day when you are drinking? 0  3. How often do you have six or more drinks on one occasion? 0  AUDIT-C Score 0   A score of 3 or more in women, and 4 or more in men indicates increased risk for alcohol abuse, EXCEPT if all of the points are from question 1   No results found for any visits on 09/20/21.  Assessment & Plan     Annual wellness visit done today including the all of the following: Reviewed patient's Family Medical History Reviewed and updated list of patient's medical providers Assessment of cognitive impairment was done Assessed patient's functional ability Established a written schedule for health screening [place] Completed and Reviewed  Exercise Activities and Dietary recommendations  Goals      DIET - EAT MORE FRUITS AND VEGETABLES     Recommend to continue increasing fruits and vegetables in daily diet (2 servings each a day).        Immunization History  Administered Date(s) Administered   Influenza Split 09/29/2011   Influenza-Unspecified 07/06/2018   Pneumococcal Conjugate-13 06/25/2014   Pneumococcal Polysaccharide-23 02/17/2016   Td 09/29/2011   Tdap 09/29/2011    Health Maintenance  Topic Date Due   COVID-19 Vaccine (1) Never done   MAMMOGRAM  12/14/2018   Fecal DNA (Cologuard)  01/27/2020   INFLUENZA VACCINE  12/03/2021 (Originally 04/05/2021)    Zoster Vaccines- Shingrix (1 of 2) 12/19/2021 (Originally [birth year]01/1966)   TETANUS/TDAP  09/28/2021   Pneumonia Vaccine 75+ Years old  Completed   DEXA SCAN  Completed   Hepatitis C Screening  Completed   HPV VACCINES  Aged Out     Discussed health benefits of physical activity, and encouraged her to engage in regular exercise appropriate for her age and condition.    Problem List Items Addressed This Visit       Cardiovascular and Mediastinum   Essential hypertension    Well controlled on home readings Continue current medications Recheck metabolic panel F/u in 6 months       Relevant Orders   Comprehensive metabolic panel   Lipid panel     Other   History of endometrial cancer    S/p hysterectomy in 2013  Discussed that evidence (including ACOG) does not recommend pap smears after hysterectomy for endometrial cancer      Prediabetes    Recommend low carb diet Recheck A1c       Relevant Orders   Hemoglobin A1c   Morbid obesity (HCC)    BMI 38 and associated with HTN and prediabetes Discussed importance of healthy weight management Discussed diet and exercise       Relevant Orders   Lipid panel   Other Visit Diagnoses     Encounter for annual wellness visit (AWV) in Medicare patient    -  Primary   Encounter for annual physical exam       Relevant Orders   Comprehensive metabolic panel   Hemoglobin  A1c   Lipid panel   Colon cancer screening       Relevant Orders   Cologuard   Screening mammogram for breast cancer       Relevant Orders   MM 3D SCREEN BREAST BILATERAL        Return in about 6 months (around 03/20/2022) for chronic disease f/u, With new PCP.     I, Lavon Paganini, MD, have reviewed all documentation for this visit. The documentation on 09/20/21 for the exam, diagnosis, procedures, and orders are all accurate and complete.   Latorie Montesano, Dionne Bucy, MD, MPH [facility]

## 2021-09-20 NOTE — Assessment & Plan Note (Signed)
BMI 38 and associated with HTN and prediabetes Discussed importance of healthy weight management Discussed diet and exercise

## 2021-09-20 NOTE — Assessment & Plan Note (Signed)
S/p hysterectomy in 2013  Discussed that evidence (including ACOG) does not recommend pap smears after hysterectomy for endometrial cancer

## 2021-09-20 NOTE — Assessment & Plan Note (Signed)
Well controlled on home readings Continue current medications Recheck metabolic panel F/u in 6 months  

## 2021-09-21 ENCOUNTER — Telehealth: Payer: Self-pay

## 2021-09-21 DIAGNOSIS — R7989 Other specified abnormal findings of blood chemistry: Secondary | ICD-10-CM

## 2021-09-21 LAB — LIPID PANEL
Chol/HDL Ratio: 3.7 ratio (ref 0.0–4.4)
Cholesterol, Total: 193 mg/dL (ref 100–199)
HDL: 52 mg/dL (ref >39)
LDL Chol Calc (NIH): 127 mg/dL — ABNORMAL HIGH (ref 0–99)
Triglycerides: 77 mg/dL (ref 0–149)
VLDL Cholesterol Cal: 14 mg/dL (ref 5–40)

## 2021-09-21 LAB — COMPREHENSIVE METABOLIC PANEL
ALT: 11 IU/L (ref 0–32)
AST: 16 IU/L (ref 0–40)
Albumin/Globulin Ratio: 1.3 (ref 1.2–2.2)
Albumin: 4.3 g/dL (ref 3.7–4.7)
Alkaline Phosphatase: 75 IU/L (ref 44–121)
BUN/Creatinine Ratio: 19 (ref 12–28)
BUN: 22 mg/dL (ref 8–27)
Bilirubin Total: 0.2 mg/dL (ref 0.0–1.2)
CO2: 24 mmol/L (ref 20–29)
Calcium: 9.3 mg/dL (ref 8.7–10.3)
Chloride: 101 mmol/L (ref 96–106)
Creatinine, Ser: 1.18 mg/dL — ABNORMAL HIGH (ref 0.57–1.00)
Globulin, Total: 3.2 g/dL (ref 1.5–4.5)
Glucose: 107 mg/dL — ABNORMAL HIGH (ref 70–99)
Potassium: 4.7 mmol/L (ref 3.5–5.2)
Sodium: 140 mmol/L (ref 134–144)
Total Protein: 7.5 g/dL (ref 6.0–8.5)
eGFR: 48 mL/min/{1.73_m2} — ABNORMAL LOW (ref >59)

## 2021-09-21 LAB — HEMOGLOBIN A1C
Est. average glucose Bld gHb Est-mCnc: 114 mg/dL
Hgb A1c MFr Bld: 5.6 % (ref 4.8–5.6)

## 2021-09-21 NOTE — Telephone Encounter (Signed)
Patient advised. Lab ordered

## 2021-09-21 NOTE — Telephone Encounter (Signed)
-----   Message from Virginia Crews, MD sent at 09/21/2021  8:06 AM EST ----- Normal/stable labs, except for slight increase in Creatinine (a marker of kidney function). Recommend hydrating well and holding any NSAIDs. Repeat BMP in 1 month.

## 2021-09-29 DIAGNOSIS — H353211 Exudative age-related macular degeneration, right eye, with active choroidal neovascularization: Secondary | ICD-10-CM | POA: Diagnosis not present

## 2021-10-07 ENCOUNTER — Other Ambulatory Visit: Payer: Self-pay | Admitting: Physician Assistant

## 2021-10-07 DIAGNOSIS — I10 Essential (primary) hypertension: Secondary | ICD-10-CM

## 2021-10-21 DIAGNOSIS — Z1211 Encounter for screening for malignant neoplasm of colon: Secondary | ICD-10-CM | POA: Diagnosis not present

## 2021-10-27 ENCOUNTER — Telehealth: Payer: Self-pay

## 2021-10-27 NOTE — Telephone Encounter (Signed)
Copied from [place] [id number]. Topic: General - Other >> Oct 27, 2021  8:38 AM Valere Dross wrote: Reason for CRM: Pt called in stating if Provider Drubel can cancel the cologuard she has recently sent the pt. Pt states she has already done one a few weeks ago with dr. Jacinto Reap.

## 2021-10-28 LAB — COLOGUARD: COLOGUARD: NEGATIVE

## 2021-10-28 NOTE — Telephone Encounter (Signed)
I called and advised patient of message below. Patient states she is 100% sure she received a mychart message stating that a Cologuard test has been ordered under provider [name], and the kit should be arriving at her home soon. I advised patient that I didn't see any mychart messages from Ria Comment that was sent to patient. She verbalized understanding and requested that I be sure to let Ria Comment know that she did receive a mychart message notifying her that a cologuard test had been ordered under Drubel and is being sent to her home.

## 2021-11-03 DIAGNOSIS — R7989 Other specified abnormal findings of blood chemistry: Secondary | ICD-10-CM | POA: Diagnosis not present

## 2021-11-04 LAB — COMPREHENSIVE METABOLIC PANEL
ALT: 9 IU/L (ref 0–32)
AST: 13 IU/L (ref 0–40)
Albumin/Globulin Ratio: 1.5 (ref 1.2–2.2)
Albumin: 4.1 g/dL (ref 3.7–4.7)
Alkaline Phosphatase: 85 IU/L (ref 44–121)
BUN/Creatinine Ratio: 24 (ref 12–28)
BUN: 27 mg/dL (ref 8–27)
Bilirubin Total: 0.2 mg/dL (ref 0.0–1.2)
CO2: 24 mmol/L (ref 20–29)
Calcium: 9.1 mg/dL (ref 8.7–10.3)
Chloride: 96 mmol/L (ref 96–106)
Creatinine, Ser: 1.12 mg/dL — ABNORMAL HIGH (ref 0.57–1.00)
Globulin, Total: 2.8 g/dL (ref 1.5–4.5)
Glucose: 107 mg/dL — ABNORMAL HIGH (ref 70–99)
Potassium: 3.7 mmol/L (ref 3.5–5.2)
Sodium: 138 mmol/L (ref 134–144)
Total Protein: 6.9 g/dL (ref 6.0–8.5)
eGFR: 52 mL/min/{1.73_m2} — ABNORMAL LOW (ref >59)

## 2021-11-08 DIAGNOSIS — H353211 Exudative age-related macular degeneration, right eye, with active choroidal neovascularization: Secondary | ICD-10-CM | POA: Diagnosis not present

## 2021-12-20 DIAGNOSIS — H353211 Exudative age-related macular degeneration, right eye, with active choroidal neovascularization: Secondary | ICD-10-CM | POA: Diagnosis not present

## 2022-01-04 ENCOUNTER — Ambulatory Visit (INDEPENDENT_AMBULATORY_CARE_PROVIDER_SITE_OTHER): Payer: Medicare HMO | Admitting: Dermatology

## 2022-01-04 DIAGNOSIS — M898X8 Other specified disorders of bone, other site: Secondary | ICD-10-CM

## 2022-01-04 DIAGNOSIS — D2261 Melanocytic nevi of right upper limb, including shoulder: Secondary | ICD-10-CM | POA: Diagnosis not present

## 2022-01-04 DIAGNOSIS — D229 Melanocytic nevi, unspecified: Secondary | ICD-10-CM

## 2022-01-04 DIAGNOSIS — D2262 Melanocytic nevi of left upper limb, including shoulder: Secondary | ICD-10-CM

## 2022-01-04 DIAGNOSIS — D1800 Hemangioma unspecified site: Secondary | ICD-10-CM

## 2022-01-04 DIAGNOSIS — L821 Other seborrheic keratosis: Secondary | ICD-10-CM

## 2022-01-04 DIAGNOSIS — M898X9 Other specified disorders of bone, unspecified site: Secondary | ICD-10-CM

## 2022-01-04 DIAGNOSIS — L820 Inflamed seborrheic keratosis: Secondary | ICD-10-CM

## 2022-01-04 DIAGNOSIS — Z1283 Encounter for screening for malignant neoplasm of skin: Secondary | ICD-10-CM | POA: Diagnosis not present

## 2022-01-04 DIAGNOSIS — Z86018 Personal history of other benign neoplasm: Secondary | ICD-10-CM | POA: Diagnosis not present

## 2022-01-04 DIAGNOSIS — L578 Other skin changes due to chronic exposure to nonionizing radiation: Secondary | ICD-10-CM | POA: Diagnosis not present

## 2022-01-04 DIAGNOSIS — L814 Other melanin hyperpigmentation: Secondary | ICD-10-CM | POA: Diagnosis not present

## 2022-01-04 DIAGNOSIS — Z85828 Personal history of other malignant neoplasm of skin: Secondary | ICD-10-CM

## 2022-01-04 DIAGNOSIS — L738 Other specified follicular disorders: Secondary | ICD-10-CM

## 2022-01-04 DIAGNOSIS — L570 Actinic keratosis: Secondary | ICD-10-CM

## 2022-01-04 NOTE — Patient Instructions (Addendum)
Cryotherapy Aftercare ? ?Wash gently with soap and water everyday.   ?Apply Vaseline and Band-Aid daily until healed.  ? ?Seborrheic Keratosis ? ?What causes seborrheic keratoses? ?Seborrheic keratoses are harmless, common skin growths that first appear during adult life.  As time goes by, more growths appear.  Some people may develop a large number of them.  Seborrheic keratoses appear on both covered and uncovered body parts.  They are not caused by sunlight.  The tendency to develop seborrheic keratoses can be inherited.  They vary in color from skin-colored to gray, brown, or even black.  They can be either smooth or have a rough, warty surface.   ?Seborrheic keratoses are superficial and look as if they were stuck on the skin.  Under the microscope this type of keratosis looks like layers upon layers of skin.  That is why at times the top layer may seem to fall off, but the rest of the growth remains and re-grows.   ? ?Treatment ?Seborrheic keratoses do not need to be treated, but can easily be removed in the office.  Seborrheic keratoses often cause symptoms when they rub on clothing or jewelry.  Lesions can be in the way of shaving.  If they become inflamed, they can cause itching, soreness, or burning.  Removal of a seborrheic keratosis can be accomplished by freezing, burning, or surgery. ?If any spot bleeds, scabs, or grows rapidly, please return to have it checked, as these can be an indication of a skin cancer. ? ? ?Melanoma ABCDEs ? ?Melanoma is the most dangerous type of skin cancer, and is the leading cause of death from skin disease.  You are more likely to develop melanoma if you: ?Have light-colored skin, light-colored eyes, or red or blond hair ?Spend a lot of time in the sun ?Tan regularly, either outdoors or in a tanning bed ?Have had blistering sunburns, especially during childhood ?Have a close family member who has had a melanoma ?Have atypical moles or large birthmarks ? ?Early detection of  melanoma is key since treatment is typically straightforward and cure rates are extremely high if we catch it early.  ? ?The first sign of melanoma is often a change in a mole or a new dark spot.  The ABCDE system is a way of remembering the signs of melanoma. ? ?A for asymmetry:  The two halves do not match. ?B for border:  The edges of the growth are irregular. ?C for color:  A mixture of colors are present instead of an even brown color. ?D for diameter:  Melanomas are usually (but not always) greater than 27m - the size of a pencil eraser. ?E for evolution:  The spot keeps changing in size, shape, and color. ? ?Please check your skin once per month between visits. You can use a small mirror in front and a large mirror behind you to keep an eye on the back side or your body.  ? ?If you see any new or changing lesions before your next follow-up, please call to schedule a visit. ? ?Please continue daily skin protection including broad spectrum sunscreen SPF 30+ to sun-exposed areas, reapplying every 2 hours as needed when you're outdoors.  ? ?Staying in the shade or wearing long sleeves, sun glasses (UVA+UVB protection) and wide brim hats (4-inch brim around the entire circumference of the hat) are also recommended for sun protection.   ? ?If You Need Anything After Your Visit ? ?If you have any questions or concerns for  your doctor, please call our main line at [phone] and press option 4 to reach your doctor's medical assistant. If no one answers, please leave a voicemail as directed and we will return your call as soon as possible. Messages left after 4 pm will be answered the following business day.  ? ?You may also send [place] a message via MyChart. We typically respond to MyChart messages within 1-2 business days. ? ?For prescription refills, please ask your pharmacy to contact our office. Our fax number is [phone]. ? ?If you have an urgent issue when the clinic is closed that cannot wait until the next  business day, you can page your doctor at the number below.   ? ?Please note that while we do our best to be available for urgent issues outside of office hours, we are not available 24/7.  ? ?If you have an urgent issue and are unable to reach [place], you may choose to seek medical care at your doctor's office, retail clinic, urgent care center, or emergency room. ? ?If you have a medical emergency, please immediately call 911 or go to the emergency department. ? ?Pager Numbers ? ?- Dr. Nehemiah Massed: [phone] ? ?- Dr. Laurence Ferrari: [phone] ? ?- Dr. Nicole Kindred: [phone] ? ?In the event of inclement weather, please call our main line at [phone] for an update on the status of any delays or closures. ? ?Dermatology Medication Tips: ?Please keep the boxes that topical medications come in in order to help keep track of the instructions about where and how to use these. Pharmacies typically print the medication instructions only on the boxes and not directly on the medication tubes.  ? ?If your medication is too expensive, please contact our office at [phone] option 4 or send [place] a message through [place].  ? ?We are unable to tell what your co-pay for medications will be in advance as this is different depending on your insurance coverage. However, we may be able to find a substitute medication at lower cost or fill out paperwork to get insurance to cover a needed medication.  ? ?If a prior authorization is required to get your medication covered by your insurance company, please allow [place] 1-2 business days to complete this process. ? ?Drug prices often vary depending on where the prescription is filled and some pharmacies may offer cheaper prices. ? ?The website www.goodrx.com contains coupons for medications through different pharmacies. The prices here do not account for what the cost may be with help from insurance (it may be cheaper with your insurance), but the website can give you the price if you did not use  any insurance.  ?- You can print the associated coupon and take it with your prescription to the pharmacy.  ?- You may also stop by our office during regular business hours and pick up a GoodRx coupon card.  ?- If you need your prescription sent electronically to a different pharmacy, notify our office through [facility] or by phone at [phone] option 4. ? ? ? ? ?Si Usted Necesita Algo Despu?s de Su Visita ? ?Tambi?n puede enviarnos un mensaje a trav?s de MyChart. Por lo general respondemos a los mensajes de MyChart en el transcurso de 1 a 2 d?as h?biles. ? ?Para renovar recetas, por favor pida a su farmacia que se ponga en contacto con nuestra oficina. Nuestro n?mero de fax es el [phone]. ? ?Si tiene un asunto urgente cuando la cl?nica est? cerrada y que no puede esperar Museum/gallery curator  siguiente d?a h?bil, puede llamar/localizar a su doctor(a) al n?mero que aparece a continuaci?n.  ? ?Por favor, tenga en cuenta que aunque hacemos todo lo posible para estar disponibles para asuntos urgentes fuera del horario de oficina, no estamos disponibles las 24 horas del d?a, los 7 d?as de la semana.  ? ?Si tiene un problema urgente y no puede comunicarse con nosotros, puede optar por buscar atenci?n m?dica  en el consultorio de su doctor(a), en una cl?nica privada, en un centro de atenci?n urgente o en una sala de emergencias. ? ?Si tiene Engineer, maintenance (IT) m?dica, por favor llame inmediatamente al 911 o vaya a la sala de emergencias. ? ?N?meros de b?per ? ?- Dr. Nehemiah Massed: [phone] ? ?- Dra. Moye: [phone] ? ?- Dra. Nicole Kindred: [phone] ? ?En caso de inclemencias del tiempo, por favor llame a nuestra l?nea principal al [phone] para una actualizaci?n sobre el estado de cualquier retraso o cierre. ? ?Consejos para la medicaci?n en dermatolog?a: ?Por favor, guarde las cajas en las que vienen los medicamentos de uso t?pico para ayudarle a seguir las instrucciones sobre d?nde y c?mo usarlos. Las farmacias  generalmente imprimen las instrucciones del medicamento s?lo en las cajas y no directamente en los tubos del [place].  ? ?Si su medicamento es muy caro, por favor, p?ngase en contacto con nuestra oficin

## 2022-01-04 NOTE — Progress Notes (Signed)
? ?Follow-Up Visit ?  ?Subjective  ?Kathleen Glenn is a 76 y.o. female who presents for the following: Annual Exam. ? ?The patient presents for Total-Body Skin Exam (TBSE) for skin cancer screening and mole check.  The patient has spots, moles and lesions to be evaluated, some may be new or changing. She has a history of BCC of the left medial breast and right chin. 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 ? ?The following portions of the chart were reviewed this encounter and updated as appropriate:  ?  ?  ? ?Review of Systems:  No other skin or systemic complaints except as noted in HPI or Assessment and Plan. ? ?Objective  ?Well appearing patient in no apparent distress; mood and affect are within normal limits. ? ?A full examination was performed including scalp, head, eyes, ears, nose, lips, neck, chest, axillae, abdomen, back, buttocks, bilateral upper extremities, bilateral lower extremities, hands, feet, fingers, toes, fingernails, and toenails. All findings within normal limits unless otherwise noted below. ? ?spinal upper back ?Erythematous stuck-on, waxy papule ? ?posterior crown x 2 (2) ?Pink scaly papules ? ?Left Posterior Upper Arm ?5.0 mm speckled brown macule - stable compared to previous photo ? ?Right antecubital ?3.0 mm medium dark brown macule, lighter center ? ? ? ? ? ? ? ? ? ? ?forehead ?Sup Forehead, Inf forehead: ?Firm nodules, sup forehead 0.8 cm and inf forehead 0.6 cm ? ? ? ?Assessment & Plan  ?Skin cancer screening performed today. ? ?Actinic Damage ?- chronic, secondary to cumulative UV radiation exposure/sun exposure over time ?- diffuse scaly erythematous macules with underlying dyspigmentation ?- Recommend daily broad spectrum sunscreen SPF 30+ to sun-exposed areas, reapply every 2 hours as needed.  ?- Recommend staying in the shade or wearing long sleeves, sun glasses (UVA+UVB protection) and wide brim hats (4-inch brim around the entire circumference of the  hat). ?- Call for new or changing lesions. ? ?Seborrheic Keratoses ?- Stuck-on, waxy, tan-brown papules and/or plaques, including right lower calf, abdomen  ?- Benign-appearing ?- Discussed benign etiology and prognosis. ?- Observe ?- Call for any changes ? ?Lentigines ?- Scattered tan macules ?- Due to sun exposure ?- Benign-appering, observe ?- Recommend daily broad spectrum sunscreen SPF 30+ to sun-exposed areas, reapply every 2 hours as needed. ?- Call for any changes ? ?History of Basal Cell Carcinoma of the Skin ?- No evidence of recurrence today ?- Recommend regular full body skin exams ?- Recommend daily broad spectrum sunscreen SPF 30+ to sun-exposed areas, reapply every 2 hours as needed.  ?- Call if any new or changing lesions are noted between office visits ? ?History of Atypical Nevus ?- No evidence of recurrence today of the left post arm above elbow ?- Recommend regular full body skin exams ?- Recommend daily broad spectrum sunscreen SPF 30+ to sun-exposed areas, reapply every 2 hours as needed.  ?- Call if any new or changing lesions are noted between office visits ? ?Sebaceous Hyperplasia ?- Small yellow papules with a central dell ?- Benign ?- Observe ? ?Hemangiomas ?- Red papules ?- Discussed benign nature ?- Observe ?- Call for any changes ? ?Inflamed seborrheic keratosis ?spinal upper back ? ?Destruction of lesion - spinal upper back ? ?Destruction method: cryotherapy   ?Informed consent: discussed and consent obtained   ?Lesion destroyed using liquid nitrogen: Yes   ?Region frozen until ice ball extended beyond lesion: Yes   ?Outcome: patient tolerated procedure well with no complications   ?  Post-procedure details: wound care instructions given   ?Additional details:  Prior to procedure, discussed risks of blister formation, small wound, skin dyspigmentation, or rare scar following cryotherapy. Recommend Vaseline ointment to treated areas while healing. ? ? ?AK (actinic keratosis)  (2) ?posterior crown x 2 ? ?Actinic keratoses are precancerous spots that appear secondary to cumulative UV radiation exposure/sun exposure over time. They are chronic with expected duration over 1 year. A portion of actinic keratoses will progress to squamous cell carcinoma of the skin. It is not possible to reliably predict which spots will progress to skin cancer and so treatment is recommended to prevent development of skin cancer. ? ?Recommend daily broad spectrum sunscreen SPF 30+ to sun-exposed areas, reapply every 2 hours as needed.  ?Recommend staying in the shade or wearing long sleeves, sun glasses (UVA+UVB protection) and wide brim hats (4-inch brim around the entire circumference of the hat). ?Call for new or changing lesions. ? ?Destruction of lesion - posterior crown x 2 ? ?Destruction method: cryotherapy   ?Informed consent: discussed and consent obtained   ?Lesion destroyed using liquid nitrogen: Yes   ?Region frozen until ice ball extended beyond lesion: Yes   ?Outcome: patient tolerated procedure well with no complications   ?Post-procedure details: wound care instructions given   ?Additional details:  Prior to procedure, discussed risks of blister formation, small wound, skin dyspigmentation, or rare scar following cryotherapy. Recommend Vaseline ointment to treated areas while healing. ? ? ?Nevus (2) ?Left Posterior Upper Arm; Right antecubital ? ?Benign-appearing.  Stable. Observation.  Call clinic for new or changing moles.  Recommend daily use of broad spectrum spf 30+ sunscreen to sun-exposed areas.  ? ?Discussed shave removal to nevus of the left post upper arm due to hx of atypical nevus in same area.  Patient defers today and prefers to observe. New photo taken today.  ? ?Bony prominence ?forehead ? ?Observation. Stable compared to previous note. No changes per patient.  ? ? ?Return in about 1 year (around 01/05/2023) for TBSE, Hx BCC, Hx AKs. ? ?I, Jamesetta Orleans, CMA, am acting as scribe  for Brendolyn Patty, MD . ?Documentation: I have reviewed the above documentation for accuracy and completeness, and I agree with the above. ? ?Brendolyn Patty MD  ? ?

## 2022-01-26 DIAGNOSIS — H353211 Exudative age-related macular degeneration, right eye, with active choroidal neovascularization: Secondary | ICD-10-CM | POA: Diagnosis not present

## 2022-03-16 DIAGNOSIS — H353211 Exudative age-related macular degeneration, right eye, with active choroidal neovascularization: Secondary | ICD-10-CM | POA: Diagnosis not present

## 2022-03-22 ENCOUNTER — Ambulatory Visit: Payer: Medicare HMO | Admitting: Physician Assistant

## 2022-04-15 ENCOUNTER — Ambulatory Visit: Payer: Self-pay | Admitting: *Deleted

## 2022-04-15 NOTE — Patient Outreach (Signed)
  Care Coordination   04/15/2022 Name: Kathleen Glenn MRN: [id number] DOB: [birth year]   Care Coordination Outreach Attempts:  An unsuccessful telephone outreach was attempted today to offer the patient information about available care coordination services as a benefit of their health plan.   Follow Up Plan:  Additional outreach attempts will be made to offer the patient care coordination information and services.   Encounter Outcome:  No Answer  Care Coordination Interventions Activated:  No   Care Coordination Interventions:  No, not indicated    Aiza Vollrath, [place] Worker  [facility] Care Management [phone]

## 2022-04-21 DIAGNOSIS — H353211 Exudative age-related macular degeneration, right eye, with active choroidal neovascularization: Secondary | ICD-10-CM | POA: Diagnosis not present

## 2022-05-18 ENCOUNTER — Ambulatory Visit (INDEPENDENT_AMBULATORY_CARE_PROVIDER_SITE_OTHER): Payer: Medicare HMO | Admitting: Physician Assistant

## 2022-05-18 ENCOUNTER — Encounter: Payer: Self-pay | Admitting: Physician Assistant

## 2022-05-18 ENCOUNTER — Ambulatory Visit: Payer: Medicare HMO | Admitting: Physician Assistant

## 2022-05-18 VITALS — BP 139/80 | HR 92 | Ht 63.0 in | Wt 194.0 lb

## 2022-05-18 DIAGNOSIS — I10 Essential (primary) hypertension: Secondary | ICD-10-CM

## 2022-05-18 DIAGNOSIS — D649 Anemia, unspecified: Secondary | ICD-10-CM

## 2022-05-18 NOTE — Assessment & Plan Note (Signed)
Historically, seen on hosp admission a year ago Will repeat CBC to ensure resolution

## 2022-05-18 NOTE — Assessment & Plan Note (Addendum)
Managed with maxide Ordered cmp In range at home F/u 6 mo

## 2022-05-18 NOTE — Progress Notes (Signed)
I,Sha'taria Tyson,acting as a Education administrator for [employer], PA-C.,have documented all relevant documentation on the behalf of Kathleen Kirschner, PA-C,as directed by  Kathleen Kirschner, PA-C while in the presence of Kathleen Kirschner, PA-C.   Established patient visit   Patient: Kathleen Glenn   DOB: [birth year]   76 y.o. Female  MRN: [id number] Visit Date: 05/18/2022  Today's healthcare provider: [name], PA-C   Cc. Htn f/u  Subjective    HPI  Hypertension, follow-up  BP Readings from Last 3 Encounters:  05/18/22 139/80  09/20/21 134/82  04/28/21 (!) 146/73   Wt Readings from Last 3 Encounters:  05/18/22 194 lb (88 kg)  09/20/21 208 lb (94.3 kg)  04/23/21 199 lb 15.3 oz (90.7 kg)     She was last seen for hypertension 8 months ago.  BP at that visit was 134/82. Management since that visit includes continue current medications.  She reports excellent compliance with treatment. She is not having side effects.  She is following a Regular diet. She is exercising. She does not smoke.  Use of agents associated with hypertension: none.   Outside blood pressures are checked on occasion 139/80 this morning Symptoms: No chest pain No chest pressure  No palpitations No syncope  No dyspnea No orthopnea  No paroxysmal nocturnal dyspnea No lower extremity edema   Pertinent labs Lab Results  Component Value Date   CHOL 193 09/20/2021   HDL 52 09/20/2021   LDLCALC 127 (H) 09/20/2021   TRIG 77 09/20/2021   CHOLHDL 3.7 09/20/2021   Lab Results  Component Value Date   NA 138 11/03/2021   K 3.7 11/03/2021   CREATININE 1.12 (H) 11/03/2021   EGFR 52 (L) 11/03/2021   GLUCOSE 107 (H) 11/03/2021   TSH 0.843 10/25/2016     The 10-year ASCVD risk score (Arnett DK, et al., 2019) is: 22%  ---------------------------------------------------------------------------------------------------   Medications: Outpatient Medications Prior to Visit  Medication Sig   Biotin 1 MG CAPS  Take by mouth daily.    Calcium Carbonate-Vit D-Min (CALCIUM 600+D PLUS MINERALS) 600-400 MG-UNIT TABS Take 1 tablet by mouth daily.   magnesium oxide (MAG-OX) 400 MG tablet Take 400 mg by mouth daily.   Multiple Vitamins-Minerals (PRESERVISION AREDS 2 PO) Take by mouth.   RA TURMERIC PO Take by mouth daily.   triamterene-hydrochlorothiazide (MAXZIDE-25) 37.5-25 MG tablet TAKE 1 TABLET BY MOUTH EVERY DAY   No facility-administered medications prior to visit.    Review of Systems  Constitutional:  Negative for fatigue and fever.  Respiratory:  Negative for cough and shortness of breath.   Cardiovascular:  Negative for chest pain and leg swelling.  Gastrointestinal:  Negative for abdominal pain.  Neurological:  Negative for dizziness and headaches.       Objective    Blood pressure 139/80, pulse 92, height 5' 3"  (1.6 m), weight 194 lb (88 kg), SpO2 100 %.   Physical Exam Constitutional:      General: She is awake.     Appearance: She is well-developed.  HENT:     Head: Normocephalic.  Eyes:     Conjunctiva/sclera: Conjunctivae normal.  Cardiovascular:     Rate and Rhythm: Normal rate and regular rhythm.     Heart sounds: Normal heart sounds.  Pulmonary:     Effort: Pulmonary effort is normal.     Breath sounds: Normal breath sounds.  Musculoskeletal:     Right lower leg: No edema.     Left lower leg:  No edema.  Skin:    General: Skin is warm.  Neurological:     Mental Status: She is alert and oriented to person, place, and time.  Psychiatric:        Attention and Perception: Attention normal.        Mood and Affect: Mood normal.        Speech: Speech normal.        Behavior: Behavior is cooperative.      No results found for any visits on 05/18/22.  Assessment & Plan     Problem List Items Addressed This Visit       Cardiovascular and Mediastinum   Essential hypertension - Primary    Managed with maxide Ordered cmp In range at home F/u 6 mo       Relevant Orders   Comprehensive Metabolic Panel (CMET)     Other   Anemia    Historically, seen on hosp admission a year ago Will repeat CBC to ensure resolution       Relevant Orders   CBC w/Diff/Platelet  Reminded mammogram. Advised could stop at 42 but can also continue screening.  Pt declined flu, reminded of td and shingles.   Return in about 6 months (around 11/16/2022) for AVW, CPE.      I, Kathleen Kirschner, PA-C have reviewed all documentation for this visit. The documentation on  05/18/2022  for the exam, diagnosis, procedures, and orders are all accurate and complete.Kathleen Kirschner, PA-C [facility] [address] [place], [place], [address] Office: [phone] Fax: [place]

## 2022-05-18 NOTE — Patient Instructions (Signed)
Please contact [phone] to schedule your mammogram. They will ask which location you prefer to be seen in. You have two options listed below.  1) [facility] located at [address] [place], [place] [address] 2) [facility] [place] located at [address] [place], [place] [address]  Please feel free to contact us if you have any further questions or concerns  

## 2022-05-19 LAB — COMPREHENSIVE METABOLIC PANEL
ALT: 9 IU/L (ref 0–32)
AST: 15 IU/L (ref 0–40)
Albumin/Globulin Ratio: 1.2 (ref 1.2–2.2)
Albumin: 3.9 g/dL (ref 3.8–4.8)
Alkaline Phosphatase: 82 IU/L (ref 44–121)
BUN/Creatinine Ratio: 23 (ref 12–28)
BUN: 20 mg/dL (ref 8–27)
Bilirubin Total: 0.5 mg/dL (ref 0.0–1.2)
CO2: 25 mmol/L (ref 20–29)
Calcium: 9.1 mg/dL (ref 8.7–10.3)
Chloride: 97 mmol/L (ref 96–106)
Creatinine, Ser: 0.87 mg/dL (ref 0.57–1.00)
Globulin, Total: 3.3 g/dL (ref 1.5–4.5)
Glucose: 101 mg/dL — ABNORMAL HIGH (ref 70–99)
Potassium: 3.7 mmol/L (ref 3.5–5.2)
Sodium: 138 mmol/L (ref 134–144)
Total Protein: 7.2 g/dL (ref 6.0–8.5)
eGFR: 70 mL/min/{1.73_m2} (ref >59)

## 2022-05-19 LAB — CBC WITH DIFFERENTIAL/PLATELET
Basophils Absolute: 0.1 10*3/uL (ref 0.0–0.2)
Basos: 1 %
EOS (ABSOLUTE): 0.1 10*3/uL (ref 0.0–0.4)
Eos: 1 %
Hematocrit: 37.7 % (ref 34.0–46.6)
Hemoglobin: 12.7 g/dL (ref 11.1–15.9)
Immature Grans (Abs): 0 10*3/uL (ref 0.0–0.1)
Immature Granulocytes: 0 %
Lymphocytes Absolute: 1.2 10*3/uL (ref 0.7–3.1)
Lymphs: 18 %
MCH: 31.6 pg (ref 26.6–33.0)
MCHC: 33.7 g/dL (ref 31.5–35.7)
MCV: 94 fL (ref 79–97)
Monocytes Absolute: 0.5 10*3/uL (ref 0.1–0.9)
Monocytes: 7 %
Neutrophils Absolute: 5 10*3/uL (ref 1.4–7.0)
Neutrophils: 73 %
Platelets: 316 10*3/uL (ref 150–450)
RBC: 4.02 x10E6/uL (ref 3.77–5.28)
RDW: 13.3 % (ref 11.7–15.4)
WBC: 6.9 10*3/uL (ref 3.4–10.8)

## 2022-05-26 DIAGNOSIS — H353211 Exudative age-related macular degeneration, right eye, with active choroidal neovascularization: Secondary | ICD-10-CM | POA: Diagnosis not present

## 2022-07-05 ENCOUNTER — Telehealth: Payer: Self-pay | Admitting: *Deleted

## 2022-07-05 NOTE — Patient Outreach (Signed)
  Care Coordination   Initial Visit Note   07/05/2022 Name: Kathleen Glenn MRN: [id number] DOB: 06-20-1946  Kathleen Glenn is a 76 y.o. year old female who sees Thedore Mins, Ria Comment, [place] for primary care. I spoke with Chrissie Noa, husband of   Kathleen Glenn by phone today.  What matters to the patients health and wellness today?  Declines to participate.    SDOH assessments and interventions completed:  No     Care Coordination Interventions Activated:  No  Care Coordination Interventions:  No, not indicated   Follow up plan: No further intervention required.   Encounter Outcome:  Pt. Refused   Valente David, RN, MSN, [place] Care Management Care Management Coordinator [phone]

## 2022-07-13 DIAGNOSIS — H353211 Exudative age-related macular degeneration, right eye, with active choroidal neovascularization: Secondary | ICD-10-CM | POA: Diagnosis not present

## 2022-08-24 DIAGNOSIS — H353211 Exudative age-related macular degeneration, right eye, with active choroidal neovascularization: Secondary | ICD-10-CM | POA: Diagnosis not present

## 2022-09-22 DIAGNOSIS — H353211 Exudative age-related macular degeneration, right eye, with active choroidal neovascularization: Secondary | ICD-10-CM | POA: Diagnosis not present

## 2022-10-08 ENCOUNTER — Other Ambulatory Visit: Payer: Self-pay | Admitting: Physician Assistant

## 2022-10-08 DIAGNOSIS — I10 Essential (primary) hypertension: Secondary | ICD-10-CM

## 2022-10-12 ENCOUNTER — Telehealth: Payer: Self-pay

## 2022-10-12 NOTE — Telephone Encounter (Signed)
Copied from [place]. Topic: General - Inquiry >> Oct 12, 2022  2:10 PM Marcellus Scott wrote: Reason for CRM: Pt called in to re-schedule CPE with PCP Ria Comment. Per pt request re-scheduled for CPE for 11/30/2022.  I explained to pt that it was her CPE at 9 AM and AWV 9:30 pt stated she wants AWV canceled.   Please advise.

## 2022-10-18 ENCOUNTER — Ambulatory Visit: Payer: Medicare HMO | Admitting: Physician Assistant

## 2022-10-24 DIAGNOSIS — H353211 Exudative age-related macular degeneration, right eye, with active choroidal neovascularization: Secondary | ICD-10-CM | POA: Diagnosis not present

## 2022-11-24 DIAGNOSIS — H353211 Exudative age-related macular degeneration, right eye, with active choroidal neovascularization: Secondary | ICD-10-CM | POA: Diagnosis not present

## 2022-11-29 NOTE — Progress Notes (Unsigned)
Kathleen Glenn,acting as a scribe for [employer], PA-C.,have documented all relevant documentation on the behalf of Kathleen Kirschner, PA-C,as directed by  Kathleen Kirschner, PA-C while in the presence of Kathleen Kirschner, PA-C.   Annual Wellness Visit     Patient: Kathleen Glenn, Female    DOB: [birth year], 77 y.o.   MRN: [id number] Visit Date: 11/30/2022  Today's Provider: [name], PA-C   Chief Complaint  Patient presents with   Annual Exam    AWV   Subjective    Kathleen Glenn is a 77 y.o. female who presents today for her Annual Wellness Visit. She reports consuming a  low-carb  diet. She walks regularly. She generally feels fairly well. She reports sleeping well. She does have additional problems to discuss today.   Pt reports a left ankle/foot injury years ago that she thinks never healed right. Recalls seeing a podiatrist years ago. She still has swelling and pain in the toes/foot/ankle, which limits her ability to walk and exercise. She has tried NSAIDs with minimal relief.   She also reports hearing changes, that she is progressively not hearing as well.   Medications: Outpatient Medications Prior to Visit  Medication Sig   Biotin 1 MG CAPS Take by mouth daily.    Calcium Carbonate-Vit D-Min (CALCIUM 600+D PLUS MINERALS) 600-400 MG-UNIT TABS Take 1 tablet by mouth daily.   magnesium oxide (MAG-OX) 400 MG tablet Take 400 mg by mouth daily.   Multiple Vitamins-Minerals (PRESERVISION AREDS 2 PO) Take by mouth.   triamterene-hydrochlorothiazide (MAXZIDE-25) 37.5-25 MG tablet TAKE 1 TABLET BY MOUTH EVERY DAY   [DISCONTINUED] RA TURMERIC PO Take by mouth daily. (Patient not taking: Reported on 11/30/2022)   No facility-administered medications prior to visit.    Allergies  Allergen Reactions   Diclofenac Sodium Swelling    Eye drops Eye drops Eye drops    Patient Care Team: Kathleen Kirschner, PA-C as PCP - General (Physician Assistant) Brendolyn Patty, MD as  Consulting Physician (Dermatology)  Review of Systems  Constitutional:  Negative for fatigue and fever.  HENT:  Positive for hearing loss.   Respiratory:  Negative for cough and shortness of breath.   Cardiovascular:  Negative for chest pain and leg swelling.  Gastrointestinal:  Negative for abdominal pain.  Musculoskeletal:  Positive for arthralgias and gait problem.  Neurological:  Negative for dizziness and headaches.       Objective    Vitals: BP 127/71 Comment: home value today  Pulse 81   Temp 98.1 F (36.7 C) (Oral)   Resp 12   Ht 5\' 3"  (1.6 m)   Wt 189 lb (85.7 kg)   SpO2 100%   BMI 33.48 kg/m     Physical Exam Constitutional:      General: She is awake.     Appearance: She is well-developed.  HENT:     Head: Normocephalic.     Ears:     Comments: B/l impacted cerumen after flushing clear, b/l TM without abnormality  Eyes:     Conjunctiva/sclera: Conjunctivae normal.  Cardiovascular:     Rate and Rhythm: Normal rate and regular rhythm.     Heart sounds: Normal heart sounds.  Pulmonary:     Effort: Pulmonary effort is normal.     Breath sounds: Normal breath sounds.  Feet:     Comments: Left great toe with lateral deviation. Significant pitting edema to L ankle  No edema R ankle or R toe deformity Skin:  General: Skin is warm.  Neurological:     Mental Status: She is alert and oriented to person, place, and time.  Psychiatric:        Attention and Perception: Attention normal.        Mood and Affect: Mood normal.        Speech: Speech normal.        Behavior: Behavior is cooperative.     Most recent functional status assessment:    11/30/2022   10:00 AM  In your present state of health, do you have any difficulty performing the following activities:  Hearing? 1  Vision? 1  Difficulty concentrating or making decisions? 0  Walking or climbing stairs? 0  Dressing or bathing? 0  Doing errands, shopping? 0   Most recent fall risk  assessment:    11/30/2022   10:00 AM  Fall Risk   Falls in the past year? 0  Risk for fall due to : No Fall Risks    Most recent depression screenings:    11/30/2022   10:00 AM 09/20/2021    1:14 PM  PHQ 2/9 Scores  PHQ - 2 Score 0 0  PHQ- 9 Score 1 0   Most recent cognitive screening:    09/20/2021    1:19 PM  6CIT Screen  What Year? 0 points  What month? 0 points  What time? 0 points  Count back from 20 0 points  Months in reverse 0 points  Repeat phrase 6 points  Total Score 6 points   Most recent Audit-C alcohol use screening    11/30/2022   10:01 AM  Alcohol Use Disorder Test (AUDIT)  3. How often do you have six or more drinks on one occasion? 0   A score of 3 or more in women, and 4 or more in men indicates increased risk for alcohol abuse, EXCEPT if all of the points are from question 1   No results found for any visits on 11/30/22.  Assessment & Plan     Annual wellness visit done today including the all of the following: Reviewed patient's Family Medical History Reviewed and updated list of patient's medical providers Assessment of cognitive impairment was done Assessed patient's functional ability Established a written schedule for health screening [place] Completed and Reviewed  Exercise Activities and Dietary recommendations --balanced diet high in fiber and protein, low in sugars, carbs, fats. --physical activity/exercise 30 minutes 3-5 times a week     Immunization History  Administered Date(s) Administered   Influenza Split 09/29/2011   Influenza-Unspecified 07/06/2018   Pneumococcal Conjugate-13 06/25/2014   Pneumococcal Polysaccharide-23 02/17/2016   Td 09/29/2011   Tdap 09/29/2011    Health Maintenance  Topic Date Due   COVID-19 Vaccine (1) Never done   Zoster Vaccines- Shingrix (1 of 2) Never done   MAMMOGRAM  12/13/2017   DTaP/Tdap/Td (3 - Td or Tdap) 09/28/2021   INFLUENZA VACCINE  12/04/2022 (Originally  04/05/2022)   Medicare Annual Wellness (AWV)  11/30/2023   Fecal DNA (Cologuard)  10/21/2024   Pneumonia Vaccine 1+ Years old  Completed   DEXA SCAN  Completed   Hepatitis C Screening  Completed   HPV VACCINES  Aged Out     Discussed health benefits of physical activity, and encouraged her to engage in regular exercise appropriate for her age and condition.    Problem List Items Addressed This Visit       Cardiovascular and Mediastinum   Essential hypertension  Significant elevation in office but pt reports always in range at home Advised next visit to bring in her BP cuff to compare For now continue maxide  Ordered cmp F/u 6 mo         Musculoskeletal and Integument   Osteopenia    Pt declines repeat dexa last scan 2018 w/ osteopenia She takes calcium vit d and exercises. Discussed if we repeated a scan and the bone density was significantly worse, I would recommend medication.  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 Impacted cerumen -flushed by CMA -afterward, clear.   Return in about 6 months (around 06/02/2023) for chronic conditions.     I, Kathleen Kirschner, PA-C have reviewed all documentation for this visit. The  documentation on  11/30/22  for the exam, diagnosis, procedures, and orders are all accurate and complete.  Kathleen Kirschner, PA-C [facility] [address] [place], [place], [address] Office: [phone] Fax: [place]

## 2022-11-30 ENCOUNTER — Encounter: Payer: Self-pay | Admitting: Physician Assistant

## 2022-11-30 ENCOUNTER — Ambulatory Visit (INDEPENDENT_AMBULATORY_CARE_PROVIDER_SITE_OTHER): Payer: Medicare HMO | Admitting: Physician Assistant

## 2022-11-30 VITALS — BP 127/71 | HR 81 | Temp 98.1°F | Resp 12 | Ht 63.0 in | Wt 189.0 lb

## 2022-11-30 DIAGNOSIS — E782 Mixed hyperlipidemia: Secondary | ICD-10-CM

## 2022-11-30 DIAGNOSIS — R739 Hyperglycemia, unspecified: Secondary | ICD-10-CM

## 2022-11-30 DIAGNOSIS — M79672 Pain in left foot: Secondary | ICD-10-CM | POA: Diagnosis not present

## 2022-11-30 DIAGNOSIS — I10 Essential (primary) hypertension: Secondary | ICD-10-CM | POA: Diagnosis not present

## 2022-11-30 DIAGNOSIS — Z1231 Encounter for screening mammogram for malignant neoplasm of breast: Secondary | ICD-10-CM

## 2022-11-30 DIAGNOSIS — H6123 Impacted cerumen, bilateral: Secondary | ICD-10-CM | POA: Diagnosis not present

## 2022-11-30 DIAGNOSIS — Z0000 Encounter for general adult medical examination without abnormal findings: Secondary | ICD-10-CM

## 2022-11-30 DIAGNOSIS — M8580 Other specified disorders of bone density and structure, unspecified site: Secondary | ICD-10-CM | POA: Insufficient documentation

## 2022-11-30 NOTE — Assessment & Plan Note (Signed)
Historically repeat a1c

## 2022-11-30 NOTE — Assessment & Plan Note (Signed)
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

## 2022-11-30 NOTE — Assessment & Plan Note (Signed)
Pt declines repeat dexa last scan 2018 w/ osteopenia She takes calcium vit d and exercises. Discussed if we repeated a scan and the bone density was significantly worse, I would recommend medication.  Pt declines additional meds, feels therefore we should not scan

## 2022-11-30 NOTE — Assessment & Plan Note (Signed)
Significant elevation in office but pt reports always in range at home Advised next visit to bring in her BP cuff to compare For now continue maxide  Ordered cmp F/u 6 mo

## 2022-12-01 LAB — COMPREHENSIVE METABOLIC PANEL
ALT: 11 IU/L (ref 0–32)
AST: 19 IU/L (ref 0–40)
Albumin/Globulin Ratio: 1.3 (ref 1.2–2.2)
Albumin: 4.3 g/dL (ref 3.8–4.8)
Alkaline Phosphatase: 88 IU/L (ref 44–121)
BUN/Creatinine Ratio: 26 (ref 12–28)
BUN: 23 mg/dL (ref 8–27)
Bilirubin Total: 0.4 mg/dL (ref 0.0–1.2)
CO2: 23 mmol/L (ref 20–29)
Calcium: 9.4 mg/dL (ref 8.7–10.3)
Chloride: 96 mmol/L (ref 96–106)
Creatinine, Ser: 0.89 mg/dL (ref 0.57–1.00)
Globulin, Total: 3.2 g/dL (ref 1.5–4.5)
Glucose: 109 mg/dL — ABNORMAL HIGH (ref 70–99)
Potassium: 4.1 mmol/L (ref 3.5–5.2)
Sodium: 137 mmol/L (ref 134–144)
Total Protein: 7.5 g/dL (ref 6.0–8.5)
eGFR: 68 mL/min/{1.73_m2} (ref >59)

## 2022-12-01 LAB — LIPID PANEL
Chol/HDL Ratio: 3.3 ratio (ref 0.0–4.4)
Cholesterol, Total: 198 mg/dL (ref 100–199)
HDL: 60 mg/dL (ref >39)
LDL Chol Calc (NIH): 125 mg/dL — ABNORMAL HIGH (ref 0–99)
Triglycerides: 70 mg/dL (ref 0–149)
VLDL Cholesterol Cal: 13 mg/dL (ref 5–40)

## 2022-12-01 LAB — HEMOGLOBIN A1C
Est. average glucose Bld gHb Est-mCnc: 126 mg/dL
Hgb A1c MFr Bld: 6 % — ABNORMAL HIGH (ref 4.8–5.6)

## 2022-12-29 DIAGNOSIS — H353211 Exudative age-related macular degeneration, right eye, with active choroidal neovascularization: Secondary | ICD-10-CM | POA: Diagnosis not present

## 2023-01-17 ENCOUNTER — Ambulatory Visit (INDEPENDENT_AMBULATORY_CARE_PROVIDER_SITE_OTHER): Payer: Medicare HMO | Admitting: Dermatology

## 2023-01-17 VITALS — BP 165/95 | HR 82

## 2023-01-17 DIAGNOSIS — M898X8 Other specified disorders of bone, other site: Secondary | ICD-10-CM

## 2023-01-17 DIAGNOSIS — Z1283 Encounter for screening for malignant neoplasm of skin: Secondary | ICD-10-CM | POA: Diagnosis not present

## 2023-01-17 DIAGNOSIS — W908XXA Exposure to other nonionizing radiation, initial encounter: Secondary | ICD-10-CM | POA: Diagnosis not present

## 2023-01-17 DIAGNOSIS — L820 Inflamed seborrheic keratosis: Secondary | ICD-10-CM | POA: Diagnosis not present

## 2023-01-17 DIAGNOSIS — L729 Follicular cyst of the skin and subcutaneous tissue, unspecified: Secondary | ICD-10-CM

## 2023-01-17 DIAGNOSIS — L570 Actinic keratosis: Secondary | ICD-10-CM

## 2023-01-17 DIAGNOSIS — X32XXXA Exposure to sunlight, initial encounter: Secondary | ICD-10-CM | POA: Diagnosis not present

## 2023-01-17 DIAGNOSIS — D2262 Melanocytic nevi of left upper limb, including shoulder: Secondary | ICD-10-CM

## 2023-01-17 DIAGNOSIS — L821 Other seborrheic keratosis: Secondary | ICD-10-CM

## 2023-01-17 DIAGNOSIS — D2261 Melanocytic nevi of right upper limb, including shoulder: Secondary | ICD-10-CM | POA: Diagnosis not present

## 2023-01-17 DIAGNOSIS — D229 Melanocytic nevi, unspecified: Secondary | ICD-10-CM

## 2023-01-17 DIAGNOSIS — L720 Epidermal cyst: Secondary | ICD-10-CM

## 2023-01-17 DIAGNOSIS — Z85828 Personal history of other malignant neoplasm of skin: Secondary | ICD-10-CM

## 2023-01-17 DIAGNOSIS — L578 Other skin changes due to chronic exposure to nonionizing radiation: Secondary | ICD-10-CM | POA: Diagnosis not present

## 2023-01-17 DIAGNOSIS — R238 Other skin changes: Secondary | ICD-10-CM

## 2023-01-17 DIAGNOSIS — Z86018 Personal history of other benign neoplasm: Secondary | ICD-10-CM

## 2023-01-17 DIAGNOSIS — M898X9 Other specified disorders of bone, unspecified site: Secondary | ICD-10-CM

## 2023-01-17 DIAGNOSIS — L814 Other melanin hyperpigmentation: Secondary | ICD-10-CM | POA: Diagnosis not present

## 2023-01-17 NOTE — Progress Notes (Signed)
Follow-Up Visit   Subjective  Kathleen Glenn is a 77 y.o. female who presents for the following: Skin Cancer Screening and Full Body Skin Exam  The patient presents for Total-Body Skin Exam (TBSE) for skin cancer screening and mole check. The patient has spots, moles and lesions to be evaluated, some may be new or changing, including spot on the right lateral foot and a cyst of the left axilla. She has a history of BCCs of the left medial breast and right chin (2018). History of atypical nevus of the left posterior arm above elbow, excised 08/17/2020. She has a bothersome spot on her right lower leg.    The following portions of the chart were reviewed this encounter and updated as appropriate: medications, allergies, medical history  Review of Systems:  No other skin or systemic complaints except as noted in HPI or Assessment and Plan.  Objective  Well appearing patient in no apparent distress; mood and affect are within normal limits.  A full examination was performed including scalp, head, eyes, ears, nose, lips, neck, chest, axillae, abdomen, back, buttocks, bilateral upper extremities, bilateral lower extremities, hands, feet, fingers, toes, fingernails, and toenails. All findings within normal limits unless otherwise noted below.   Relevant physical exam findings are noted in the Assessment and Plan.  Right calf 2 x 1 cm well-demarcated keratotic plaque, slight erythema  - photo today R calf      Sup Forehead; Inf forehead Firm nodules, sup forehead 0.8 cm and inf forehead 0.6 cm   posterior crown x 5 (5) Pink scaly papules    Assessment & Plan   LENTIGINES, SEBORRHEIC KERATOSES, HEMANGIOMAS - Benign normal skin lesions - Benign-appearing - Call for any changes  MELANOCYTIC NEVI - Tan-brown and/or pink-flesh-colored symmetric macules and papules  Left Posterior Upper Arm 5.0 mm speckled brown macule - stable compared to previous photo 01/04/2022   Right  antecubital 3.0 mm medium dark brown macule, lighter center - stable compared to photo 01/04/2022  - Benign appearing on exam today - Observation - Call clinic for new or changing moles - Recommend daily use of broad spectrum spf 30+ sunscreen to sun-exposed areas.   CALLUS vs WART Exam: firm keratotic papule right lateral plantar foot at bony pressure point  Counseling Discussed viral / HPV (Human Papilloma Virus) etiology and risk of spread /infectivity to other areas of body as well as to other people.  Multiple treatments and methods may be required to clear warts and it is possible treatment may not be successful.  Treatment risks include discoloration; scarring and there is still potential for wart recurrence.  Treatment Plan: Recommend using Curad Mediplast pads. Cut to fit wart or callus. Cover with Elastoplast waterproof tape or any waterproof band-aid. Change every 3 to 4 days, or sooner if necessary.  Treatment may require several months of regular use before results are seen.    EPIDERMAL INCLUSION CYST Exam: Firm,subcutaneous white nodule at left axilla  Benign-appearing. Exam most consistent with an epidermal inclusion cyst. Discussed that a cyst is a benign growth that can grow over time and sometimes get irritated or inflamed. Recommend observation if it is not bothersome. Discussed option of surgical excision to remove it if it is growing, symptomatic, or other changes noted. Please call for new or changing lesions so they can be evaluated.  HISTORY OF BASAL CELL CARCINOMA OF THE SKIN - No evidence of recurrence today - Recommend regular full body skin exams - Recommend daily broad  spectrum sunscreen SPF 30+ to sun-exposed areas, reapply every 2 hours as needed.  - Call if any new or changing lesions are noted between office visits  History of Atypical Nevus - No evidence of recurrence today of the left posterior upper arm above elbow - Recommend regular full body skin  exams - Recommend daily broad spectrum sunscreen SPF 30+ to sun-exposed areas, reapply every 2 hours as needed.  - Call if any new or changing lesions are noted between office visits    ACTINIC DAMAGE - Chronic condition, secondary to cumulative UV/sun exposure - diffuse scaly erythematous macules with underlying dyspigmentation - Recommend daily broad spectrum sunscreen SPF 30+ to sun-exposed areas, reapply every 2 hours as needed.  - Staying in the shade or wearing long sleeves, sun glasses (UVA+UVB protection) and wide brim hats (4-inch brim around the entire circumference of the hat) are also recommended for sun protection.  - Call for new or changing lesions.  SKIN CANCER SCREENING PERFORMED TODAY.    Inflamed seborrheic keratosis Right calf  vs Porokeratosis  Benign-appearing. Stable. Recommend observation.  Discussed biopsy if changes.   Bony prominence Sup Forehead; Inf forehead  vs Cysts.  Stable. Benign appearing. Observation.   Hypertrophic actinic keratosis (5) posterior crown x 5  vs Inflamed Seborrheic Keratosis  Actinic keratoses are precancerous spots that appear secondary to cumulative UV radiation exposure/sun exposure over time. They are chronic with expected duration over 1 year. A portion of actinic keratoses will progress to squamous cell carcinoma of the skin. It is not possible to reliably predict which spots will progress to skin cancer and so treatment is recommended to prevent development of skin cancer.  Recommend daily broad spectrum sunscreen SPF 30+ to sun-exposed areas, reapply every 2 hours as needed.  Recommend staying in the shade or wearing long sleeves, sun glasses (UVA+UVB protection) and wide brim hats (4-inch brim around the entire circumference of the hat). Call for new or changing lesions.  Destruction of lesion - posterior crown x 5  Destruction method: cryotherapy   Informed consent: discussed and consent obtained   Lesion  destroyed using liquid nitrogen: Yes   Region frozen until ice ball extended beyond lesion: Yes   Outcome: patient tolerated procedure well with no complications   Post-procedure details: wound care instructions given   Additional details:  Prior to procedure, discussed risks of blister formation, small wound, skin dyspigmentation, or rare scar following cryotherapy. Recommend Vaseline ointment to treated areas while healing.    Return 2-3 months, for recheck AK vs ISK - scalp.  ICherlyn Labella, CMA, am acting as scribe for Willeen Niece, MD .   Documentation: I have reviewed the above documentation for accuracy and completeness, and I agree with the above.  Willeen Niece, MD

## 2023-01-17 NOTE — Patient Instructions (Addendum)
Recommend using Curad Mediplast pads. Cut to fit wart or callus. Cover with Elastoplast waterproof tape or any waterproof band-aid. Change every 3 to 4 days, or sooner if necessary.  Treatment may require several months of regular use before results are seen.   Cryotherapy Aftercare  Wash gently with soap and water everyday.   Apply Vaseline and Band-Aid daily until healed.    Due to recent changes in healthcare laws, you may see results of your pathology and/or laboratory studies on MyChart before the doctors have had a chance to review them. We understand that in some cases there may be results that are confusing or concerning to you. Please understand that not all results are received at the same time and often the doctors may need to interpret multiple results in order to provide you with the best plan of care or course of treatment. Therefore, we ask that you please give [place] 2 business days to thoroughly review all your results before contacting the office for clarification. Should we see a critical lab result, you will be contacted sooner.   If You Need Anything After Your Visit  If you have any questions or concerns for your doctor, please call our main line at [phone] and press option 4 to reach your doctor's medical assistant. If no one answers, please leave a voicemail as directed and we will return your call as soon as possible. Messages left after 4 pm will be answered the following business day.   You may also send [place] a message via MyChart. We typically respond to MyChart messages within 1-2 business days.  For prescription refills, please ask your pharmacy to contact our office. Our fax number is [phone].  If you have an urgent issue when the clinic is closed that cannot wait until the next business day, you can page your doctor at the number below.    Please note that while we do our best to be available for urgent issues outside of office hours, we are not available  24/7.   If you have an urgent issue and are unable to reach [place], you may choose to seek medical care at your doctor's office, retail clinic, urgent care center, or emergency room.  If you have a medical emergency, please immediately call 911 or go to the emergency department.  Pager Numbers  - Dr. Gwen Pounds: [phone]  - Dr. Neale Burly: [phone]  - Dr. Roseanne Reno: [phone]  In the event of inclement weather, please call our main line at [phone] for an update on the status of any delays or closures.  Dermatology Medication Tips: Please keep the boxes that topical medications come in in order to help keep track of the instructions about where and how to use these. Pharmacies typically print the medication instructions only on the boxes and not directly on the medication tubes.   If your medication is too expensive, please contact our office at [phone] option 4 or send [place] a message through MyChart.   We are unable to tell what your co-pay for medications will be in advance as this is different depending on your insurance coverage. However, we may be able to find a substitute medication at lower cost or fill out paperwork to get insurance to cover a needed medication.   If a prior authorization is required to get your medication covered by your insurance company, please allow [place] 1-2 business days to complete this process.  Drug prices often vary depending on where the prescription is filled and  some pharmacies may offer cheaper prices.  The website www.goodrx.com contains coupons for medications through different pharmacies. The prices here do not account for what the cost may be with help from insurance (it may be cheaper with your insurance), but the website can give you the price if you did not use any insurance.  - You can print the associated coupon and take it with your prescription to the pharmacy.  - You may also stop by our office during regular business hours and pick up  a GoodRx coupon card.  - If you need your prescription sent electronically to a different pharmacy, notify our office through [facility] or by phone at [phone] option 4.     Si Usted Necesita Algo Despus de Su Visita  Tambin puede enviarnos un mensaje a travs de Pharmacist, community. Por lo general respondemos a los mensajes de MyChart en el transcurso de 1 a 2 das hbiles.  Para renovar recetas, por favor pida a su farmacia que se ponga en contacto con nuestra oficina. Harland Dingwall de fax es [place] [phone].  Si tiene un asunto urgente cuando la clnica est cerrada y que no puede esperar hasta el siguiente da hbil, puede llamar/localizar a su doctor(a) al nmero que aparece a continuacin.   Por favor, tenga en cuenta que aunque hacemos todo lo posible para estar disponibles para asuntos urgentes fuera del horario de [place], no estamos disponibles las 24 horas del da, los 7 das de la [place].   Si tiene un problema urgente y no puede comunicarse con nosotros, puede optar por buscar atencin mdica  en el consultorio de su doctor(a), en una clnica privada, en un centro de atencin urgente o en una sala de emergencias.  Si tiene Engineering geologist, por favor llame inmediatamente al 911 o vaya a la sala de emergencias.  Nmeros de bper  - Dr. Nehemiah Massed: [phone]  - Dra. Moye: [phone]  - Dra. Nicole Kindred: [phone]  En caso de inclemencias del [place], por favor llame a Johnsie Kindred principal al [phone] para una actualizacin sobre el [place] de cualquier retraso o cierre.  Consejos para la medicacin en dermatologa: Por favor, guarde las cajas en las que vienen los medicamentos de uso tpico para ayudarle a seguir las instrucciones sobre dnde y cmo usarlos. Las farmacias generalmente imprimen las instrucciones del medicamento slo en las cajas y no directamente en los tubos del [place].   Si su medicamento es muy caro, por favor, pngase en contacto  con Zigmund Daniel llamando al [phone] y presione la opcin 4 o envenos un mensaje a travs de Pharmacist, community.   No podemos decirle cul ser su copago por los medicamentos por adelantado ya que esto es diferente dependiendo de la cobertura de su seguro. Sin embargo, es posible que podamos encontrar un medicamento sustituto a Electrical engineer un formulario para que el seguro cubra el medicamento que se considera necesario.   Si se requiere una autorizacin previa para que su compaa de seguros [place] su medicamento, por favor permtanos de 1 a 2 das hbiles para completar este proceso.  Los precios de los medicamentos varan con frecuencia dependiendo del Environmental consultant de dnde se surte la receta y alguna farmacias pueden ofrecer precios ms baratos.  El sitio web www.goodrx.com tiene cupones para medicamentos de Airline pilot. Los precios aqu no tienen en cuenta lo que podra costar con la ayuda del seguro (puede ser ms barato con su seguro), pero el sitio web puede darle el precio  si no utiliz [employer].  - Puede imprimir el cupn correspondiente y llevarlo con su receta a la farmacia.  - Tambin puede pasar por nuestra oficina durante el horario de atencin regular y Charity fundraiser una tarjeta de cupones de GoodRx.  - Si necesita que su receta se enve electrnicamente a una farmacia diferente, informe a nuestra oficina a travs de MyChart de [place] o por telfono llamando al [phone] y presione la opcin 4.

## 2023-01-27 DIAGNOSIS — H353211 Exudative age-related macular degeneration, right eye, with active choroidal neovascularization: Secondary | ICD-10-CM | POA: Diagnosis not present

## 2023-02-08 DIAGNOSIS — H353211 Exudative age-related macular degeneration, right eye, with active choroidal neovascularization: Secondary | ICD-10-CM | POA: Diagnosis not present

## 2023-03-08 DIAGNOSIS — H353211 Exudative age-related macular degeneration, right eye, with active choroidal neovascularization: Secondary | ICD-10-CM | POA: Diagnosis not present

## 2023-03-17 ENCOUNTER — Telehealth: Payer: Self-pay | Admitting: Physician Assistant

## 2023-03-17 ENCOUNTER — Ambulatory Visit: Payer: Self-pay

## 2023-03-17 ENCOUNTER — Ambulatory Visit (INDEPENDENT_AMBULATORY_CARE_PROVIDER_SITE_OTHER): Payer: Medicare HMO | Admitting: Physician Assistant

## 2023-03-17 ENCOUNTER — Encounter: Payer: Self-pay | Admitting: Physician Assistant

## 2023-03-17 VITALS — BP 135/80 | HR 107 | Temp 98.2°F | Resp 14 | Ht 63.0 in

## 2023-03-17 DIAGNOSIS — J069 Acute upper respiratory infection, unspecified: Secondary | ICD-10-CM

## 2023-03-17 DIAGNOSIS — R051 Acute cough: Secondary | ICD-10-CM

## 2023-03-17 LAB — POC COVID19 BINAXNOW: SARS Coronavirus 2 Ag: NEGATIVE

## 2023-03-17 MED ORDER — BENZONATATE 100 MG PO CAPS: 100.0000 mg | ORAL_CAPSULE | Freq: Two times a day (BID) | ORAL | 0 refills | Status: DC | PRN

## 2023-03-17 MED ORDER — AZITHROMYCIN 250 MG PO TABS
ORAL_TABLET | ORAL | 0 refills | Status: AC
Start: 2023-03-17 — End: 2023-03-22

## 2023-03-17 NOTE — Telephone Encounter (Signed)
Chief Complaint: Cough Symptoms: productive cough, congestion  Frequency: Monday Pertinent Negatives: Patient denies headache, chest pain and coughing up blood Disposition: [] ED /[] Urgent Care (no appt availability in office) / [x] Appointment(In office/virtual)/ []  [facility]/ [] Home Care/ [] Refused Recommended Disposition /[] [facility]/ []  Follow-up with PCP Additional Notes: Patient states her husband was recently dx with an upper respiratory infection and now she is having similar symptoms. Patient reports coughing up clear phlegm, nasal congestion, runny nose and overall not feeling well. Patient advised she would need to be evaluated and patient was agreeable. Patient scheduled today at 1300 with provider.    Summary: cough and congestion   Patient thinks she has an upper respiratory infection as she has he same symptoms her husband has and he was diagnosed earlier this week. Patient is asking that provider prescribe something for her. She is experiencing cough and congestion     Reason for Disposition  Cough has been present for > 3 weeks  Answer Assessment - Initial Assessment Questions 1. ONSET: "When did the cough begin?"      Monday or Tuesday this week. 2. SEVERITY: "How bad is the cough today?"      Moderate but it is worse at night  3. SPUTUM: "Describe the color of your sputum" (none, dry cough; clear, white, yellow, green)     I think it is clear 4. HEMOPTYSIS: "Are you coughing up any blood?" If so ask: "How much?" (flecks, streaks, tablespoons, etc.)     No 5. DIFFICULTY BREATHING: "Are you having difficulty breathing?" If Yes, ask: "How bad is it?" (e.g., mild, moderate, severe)    - MILD: No SOB at rest, mild SOB with walking, speaks normally in sentences, can lie down, no retractions, pulse < 100.    - MODERATE: SOB at rest, SOB with minimal exertion and prefers to sit, cannot lie down flat, speaks in phrases, mild retractions, audible wheezing,  pulse 100-120.    - SEVERE: Very SOB at rest, speaks in single words, struggling to breathe, sitting hunched forward, retractions, pulse > 120      No but I have some congestion last night  6. FEVER: "Do you have a fever?" If Yes, ask: "What is your temperature, how was it measured, and when did it start?"     98 degrees right now      8. LUNG HISTORY: "Do you have any history of lung disease?"  (e.g., pulmonary embolus, asthma, emphysema)     No 9. PE RISK FACTORS: "Do you have a history of blood clots?" (or: recent major surgery, recent prolonged travel, bedridden)     No 10. OTHER SYMPTOMS: "Do you have any other symptoms?" (e.g., runny nose, wheezing, chest pain)       Congestion, runny nose yesterday  Protocols used: Cough - Acute Productive-A-AH

## 2023-03-17 NOTE — Progress Notes (Signed)
Established patient visit   Patient: Kathleen Glenn   DOB: 1946-02-15   77 y.o. Female  MRN: [id number] Visit Date: 03/17/2023  Today's healthcare provider: Alfredia Ferguson, PA-C   Chief Complaint  Patient presents with   Cough   Subjective    HPI Discussed the use of AI scribe software for clinical note transcription with the patient, who gave verbal consent to proceed.  History of Present Illness   The patient presents with symptoms of an upper respiratory infection that have been ongoing for over a week. She reports that her husband had similar symptoms and was diagnosed with an upper respiratory infection. The patient's symptoms have included a severe cough, particularly at night, which has been so severe that it has prevented her from sleeping. She also reports experiencing head congestion,, and had white mucus draining from her eyes, although this has since stopped. She has not been taking any over-the-counter medications for her symptoms, but has tried drinking whiskey to alleviate her cough.      Medications: Outpatient Medications Prior to Visit  Medication Sig   Biotin 1 MG CAPS Take by mouth daily.    Calcium Carbonate-Vit D-Min (CALCIUM 600+D PLUS MINERALS) 600-400 MG-UNIT TABS Take 1 tablet by mouth daily.   magnesium oxide (MAG-OX) 400 MG tablet Take 400 mg by mouth daily.   Multiple Vitamins-Minerals (PRESERVISION AREDS 2) CAPS Take by mouth.   triamterene-hydrochlorothiazide (MAXZIDE-25) 37.5-25 MG tablet TAKE 1 TABLET BY MOUTH EVERY DAY   No facility-administered medications prior to visit.      Objective    BP 135/80 Comment: home value  Pulse (!) 107   Temp 98.2 F (36.8 C) (Oral)   Resp 14   Ht 5\' 3"  (1.6 m)   SpO2 100%   BMI 33.48 kg/m   Physical Exam Constitutional:      General: She is awake.     Appearance: She is well-developed.  HENT:     Head: Normocephalic.  Eyes:     Conjunctiva/sclera: Conjunctivae normal.  Cardiovascular:      Rate and Rhythm: Normal rate and regular rhythm.     Heart sounds: Normal heart sounds.  Pulmonary:     Effort: Pulmonary effort is normal.     Breath sounds: Normal breath sounds. No stridor. No wheezing or rales.  Skin:    General: Skin is warm.  Neurological:     Mental Status: She is alert and oriented to person, place, and time.  Psychiatric:        Attention and Perception: Attention normal.        Mood and Affect: Mood normal.        Speech: Speech normal.        Behavior: Behavior is cooperative.      Results for orders placed or performed in visit on 03/17/23  POC COVID-19  Result Value Ref Range   SARS Coronavirus 2 Ag Negative Negative    Assessment & Plan     Assessment and Plan    Upper Respiratory Infection:  Poc covid negative -Prescribe Tessalon Perles BID for cough, particularly before bedtime. -Recommend over-the-counter antihistamines (Zyrtec or Claritin) for inflammation and congestion. -Prescribe Azithromycin (Z-Pak)  -Avoid over-the-counter cough medications containing DM due to potential for increased blood pressure.  Hypertension: Elevated blood pressure noted during visit, potentially due to current illness. Pt reports more controlled at home -Continue monitoring blood pressure at home.   Return in about 3 months (around 06/17/2023) for  CPE.      I, Alfredia Ferguson, PA-C have reviewed all documentation for this visit. The documentation on  03/17/23   for the exam, diagnosis, procedures, and orders are all accurate and complete.  Alfredia Ferguson, PA-C [facility] [address] [place], [place], [address] Office: [phone] Fax: [phone]   [facility]

## 2023-03-21 ENCOUNTER — Encounter: Payer: Self-pay | Admitting: Dermatology

## 2023-03-21 ENCOUNTER — Ambulatory Visit (INDEPENDENT_AMBULATORY_CARE_PROVIDER_SITE_OTHER): Payer: Medicare HMO | Admitting: Dermatology

## 2023-03-21 VITALS — BP 141/82

## 2023-03-21 DIAGNOSIS — L578 Other skin changes due to chronic exposure to nonionizing radiation: Secondary | ICD-10-CM | POA: Diagnosis not present

## 2023-03-21 DIAGNOSIS — L821 Other seborrheic keratosis: Secondary | ICD-10-CM | POA: Diagnosis not present

## 2023-03-21 DIAGNOSIS — L649 Androgenic alopecia, unspecified: Secondary | ICD-10-CM | POA: Diagnosis not present

## 2023-03-21 DIAGNOSIS — W908XXA Exposure to other nonionizing radiation, initial encounter: Secondary | ICD-10-CM

## 2023-03-21 DIAGNOSIS — L570 Actinic keratosis: Secondary | ICD-10-CM | POA: Diagnosis not present

## 2023-03-21 NOTE — Patient Instructions (Addendum)
Cryotherapy Aftercare  Wash gently with soap and water everyday.   Apply Vaseline and Band-Aid daily until healed.    Recommend minoxidil 5% (Rogaine for men) solution or foam to be applied to the scalp and left in. This should ideally be used twice daily for best results but it helps with hair regrowth when used at least three times per week. Rogaine initially can cause increased hair shedding for the first few weeks but this will stop with continued use. In studies, people who used minoxidil (Rogaine) for at least 6 months had thicker hair than people who did not. Minoxidil topical (Rogaine) only works as long as it continues to be used. If if it is no longer used then the hair it has been helping to regrow can fall out. Minoxidil topical (Rogaine) can cause increased facial hair growth which can usually be managed easily with a battery-operated hair trimmer. If facial hair growth is bothersome, switching to the 2% women's version can decrease the risk of unwanted facial hair growth.   Due to recent changes in healthcare laws, you may see results of your pathology and/or laboratory studies on MyChart before the doctors have had a chance to review them. We understand that in some cases there may be results that are confusing or concerning to you. Please understand that not all results are received at the same time and often the doctors may need to interpret multiple results in order to provide you with the best plan of care or course of treatment. Therefore, we ask that you please give [place] 2 business days to thoroughly review all your results before contacting the office for clarification. Should we see a critical lab result, you will be contacted sooner.   If You Need Anything After Your Visit  If you have any questions or concerns for your doctor, please call our main line at [phone] and press option 4 to reach your doctor's medical assistant. If no one answers, please leave a voicemail as  directed and we will return your call as soon as possible. Messages left after 4 pm will be answered the following business day.   You may also send [place] a message via MyChart. We typically respond to MyChart messages within 1-2 business days.  For prescription refills, please ask your pharmacy to contact our office. Our fax number is [phone].  If you have an urgent issue when the clinic is closed that cannot wait until the next business day, you can page your doctor at the number below.    Please note that while we do our best to be available for urgent issues outside of office hours, we are not available 24/7.   If you have an urgent issue and are unable to reach [place], you may choose to seek medical care at your doctor's office, retail clinic, urgent care center, or emergency room.  If you have a medical emergency, please immediately call 911 or go to the emergency department.  Pager Numbers  - Dr. Gwen Pounds: [phone]  - Dr. Neale Burly: [phone]  - Dr. Roseanne Reno: [phone]  In the event of inclement weather, please call our main line at [phone] for an update on the status of any delays or closures.  Dermatology Medication Tips: Please keep the boxes that topical medications come in in order to help keep track of the instructions about where and how to use these. Pharmacies typically print the medication instructions only on the boxes and not directly on the medication tubes.  If your medication is too expensive, please contact our office at [phone] option 4 or send [place] a message through MyChart.   We are unable to tell what your co-pay for medications will be in advance as this is different depending on your insurance coverage. However, we may be able to find a substitute medication at lower cost or fill out paperwork to get insurance to cover a needed medication.   If a prior authorization is required to get your medication covered by your insurance company, please  allow [place] 1-2 business days to complete this process.  Drug prices often vary depending on where the prescription is filled and some pharmacies may offer cheaper prices.  The website www.goodrx.com contains coupons for medications through different pharmacies. The prices here do not account for what the cost may be with help from insurance (it may be cheaper with your insurance), but the website can give you the price if you did not use any insurance.  - You can print the associated coupon and take it with your prescription to the pharmacy.  - You may also stop by our office during regular business hours and pick up a GoodRx coupon card.  - If you need your prescription sent electronically to a different pharmacy, notify our office through [facility] or by phone at [phone] option 4.     Si Usted Necesita Algo Despus de Su Visita  Tambin puede enviarnos un mensaje a travs de Clinical cytogeneticist. Por lo general respondemos a los mensajes de MyChart en el transcurso de 1 a 2 das hbiles.  Para renovar recetas, por favor pida a su farmacia que se ponga en contacto con nuestra oficina. Annie Sable de fax es [place] [phone].  Si tiene un asunto urgente cuando la clnica est cerrada y que no puede esperar hasta el siguiente da hbil, puede llamar/localizar a su doctor(a) al nmero que aparece a continuacin.   Por favor, tenga en cuenta que aunque hacemos todo lo posible para estar disponibles para asuntos urgentes fuera del horario de [place], no estamos disponibles las 24 horas del da, los 7 [address]  [address] de la [place].   Si tiene un problema urgente y no puede comunicarse con nosotros, puede optar por buscar atencin mdica  en el consultorio de su doctor(a), en una clnica privada, en un centro de atencin urgente o en una sala de emergencias.  Si tiene Engineer, drilling, por favor llame inmediatamente al 911 o vaya a la sala de emergencias.  Nmeros de bper  - Dr. Gwen Pounds:  [phone]  - Dra. Moye: [phone]  - Dra. Roseanne Reno: [phone]  En caso de inclemencias del [place], por favor llame a Lacy Duverney principal al [phone] para una actualizacin sobre el [place] de cualquier retraso o cierre.  Consejos para la medicacin en dermatologa: Por favor, guarde las cajas en las que vienen los medicamentos de uso tpico para ayudarle a seguir las instrucciones sobre dnde y cmo usarlos. Las farmacias generalmente imprimen las instrucciones del medicamento slo en las cajas y no directamente en los tubos del [place].   Si su medicamento es muy caro, por favor, pngase en contacto con Rolm Gala llamando al [phone] y presione la opcin 4 o envenos un mensaje a travs de Clinical cytogeneticist.   No podemos decirle cul ser su copago por los medicamentos por adelantado ya que esto es diferente dependiendo de la cobertura de su seguro. Sin embargo, es posible que podamos encontrar un medicamento sustituto a Therapist, occupational  formulario para que el seguro cubra el medicamento que se considera necesario.   Si se requiere una autorizacin previa para que su compaa de seguros [place] su medicamento, por favor permtanos de 1 a 2 das hbiles para completar [address]  Los precios de los medicamentos varan con frecuencia dependiendo del Environmental consultant de dnde se surte la receta y alguna farmacias pueden ofrecer precios ms baratos.  El sitio web www.goodrx.com tiene cupones para medicamentos de Health and safety inspector. Los precios aqu no tienen en cuenta lo que podra costar con la ayuda del seguro (puede ser ms barato con su seguro), pero el sitio web puede darle el precio si no utiliz Tourist information centre manager.  - Puede imprimir el cupn correspondiente y llevarlo con su receta a la farmacia.  - Tambin puede pasar por nuestra oficina durante el horario de atencin regular y Education officer, museum una tarjeta de cupones de GoodRx.  - Si necesita que su receta se enve electrnicamente a  una farmacia diferente, informe a nuestra oficina a travs de MyChart de [place] o por telfono llamando al [phone] y presione la opcin 4.

## 2023-03-21 NOTE — Progress Notes (Signed)
Follow-Up Visit   Subjective  Kathleen Glenn is a 77 y.o. female who presents for the following: Hypertrophic AK vs ISK  post crown, 44m f/u, check spot L shoulder, hair loss 2 yrs   The following portions of the chart were reviewed this encounter and updated as appropriate: medications, allergies, medical history  Review of Systems:  No other skin or systemic complaints except as noted in HPI or Assessment and Plan.  Objective  Well appearing patient in no apparent distress; mood and affect are within normal limits.   A focused examination was performed of the following areas: scalp  Relevant exam findings are noted in the Assessment and Plan.  post crown scalp x 2, mid crown scalp x 2 (4) Pink scaly macules    Assessment & Plan   ACTINIC DAMAGE - chronic, secondary to cumulative UV radiation exposure/sun exposure over time - diffuse scaly erythematous macules with underlying dyspigmentation - Recommend daily broad spectrum sunscreen SPF 30+ to sun-exposed areas, reapply every 2 hours as needed.  - Recommend staying in the shade or wearing long sleeves, sun glasses (UVA+UVB protection) and wide brim hats (4-inch brim around the entire circumference of the hat). - Call for new or changing lesions.    AK (actinic keratosis) (4) post crown scalp x 2, mid crown scalp x 2  Vs ISK  Actinic keratoses are precancerous spots that appear secondary to cumulative UV radiation exposure/sun exposure over time. They are chronic with expected duration over 1 year. A portion of actinic keratoses will progress to squamous cell carcinoma of the skin. It is not possible to reliably predict which spots will progress to skin cancer and so treatment is recommended to prevent development of skin cancer.  Recommend daily broad spectrum sunscreen SPF 30+ to sun-exposed areas, reapply every 2 hours as needed.  Recommend staying in the shade or wearing long sleeves, sun glasses (UVA+UVB  protection) and wide brim hats (4-inch brim around the entire circumference of the hat). Call for new or changing lesions.  Destruction of lesion - post crown scalp x 2, mid crown scalp x 2 (4)  Destruction method: cryotherapy   Informed consent: discussed and consent obtained   Lesion destroyed using liquid nitrogen: Yes   Region frozen until ice ball extended beyond lesion: Yes   Outcome: patient tolerated procedure well with no complications   Post-procedure details: wound care instructions given   Additional details:  Prior to procedure, discussed risks of blister formation, small wound, skin dyspigmentation, or rare scar following cryotherapy. Recommend Vaseline ointment to treated areas while healing.    SEBORRHEIC KERATOSIS - Stuck-on, waxy, tan-brown papules and/or plaques  - Benign-appearing - Discussed benign etiology and prognosis. - Observe - Call for any changes - L post shoulder  ANDROGENETIC ALOPECIA (FEMALE PATTERN HAIR LOSS) Exam: Diffuse thinning of the crown and widening of the midline part with retention of the frontal hairline  Chronic and persistent condition with duration or expected duration over one year. Condition is symptomatic/ bothersome to patient. Not currently at goal.   Female Androgenic Alopecia is a chronic condition related to genetics and/or hormonal changes.  In women androgenetic alopecia is commonly associated with menopause but may occur any time after puberty.  It causes hair thinning primarily on the crown with widening of the part and temporal hairline recession.  Can use OTC Rogaine (minoxidil) 5% solution/foam as directed.  Oral treatments in female patients who have no contraindication may include : - Low dose  oral minoxidil 1.25 - 5mg  daily - Spironolactone 50 - 100mg  bid - Finasteride 2.5 - 5 mg daily Adjunctive therapies include: - Low Level Laser Light Therapy (LLLT) - Platelet-rich plasma injections (PRP) - Hair Transplants or  scalp reduction   Treatment Plan: Discussed checking TSH, Ferritin, Vitamin D, pt would like to defer for f/u Discussed oral treatment as above, pt defers at this time. Recommend starting Rogaine 5% foam qhs to scalp     Return for 4-31m for Alopecia, AK f/u.  I, Ardis Rowan, RMA, am acting as scribe for Willeen Niece, MD .   Documentation: I have reviewed the above documentation for accuracy and completeness, and I agree with the above.  Willeen Niece, MD

## 2023-04-05 DIAGNOSIS — H353211 Exudative age-related macular degeneration, right eye, with active choroidal neovascularization: Secondary | ICD-10-CM | POA: Diagnosis not present

## 2023-05-17 ENCOUNTER — Ambulatory Visit: Payer: Medicare HMO | Admitting: Family Medicine

## 2023-06-01 DIAGNOSIS — H353211 Exudative age-related macular degeneration, right eye, with active choroidal neovascularization: Secondary | ICD-10-CM | POA: Diagnosis not present

## 2023-07-17 DIAGNOSIS — H353211 Exudative age-related macular degeneration, right eye, with active choroidal neovascularization: Secondary | ICD-10-CM | POA: Diagnosis not present

## 2023-07-18 ENCOUNTER — Ambulatory Visit (INDEPENDENT_AMBULATORY_CARE_PROVIDER_SITE_OTHER): Payer: Medicare HMO | Admitting: Family Medicine

## 2023-07-18 ENCOUNTER — Encounter: Payer: Self-pay | Admitting: Family Medicine

## 2023-07-18 VITALS — BP 159/94 | HR 81 | Temp 98.2°F | Resp 18 | Ht 63.0 in | Wt 185.0 lb

## 2023-07-18 DIAGNOSIS — H353230 Exudative age-related macular degeneration, bilateral, stage unspecified: Secondary | ICD-10-CM

## 2023-07-18 DIAGNOSIS — R7303 Prediabetes: Secondary | ICD-10-CM | POA: Diagnosis not present

## 2023-07-18 DIAGNOSIS — L659 Nonscarring hair loss, unspecified: Secondary | ICD-10-CM

## 2023-07-18 DIAGNOSIS — F439 Reaction to severe stress, unspecified: Secondary | ICD-10-CM | POA: Diagnosis not present

## 2023-07-18 DIAGNOSIS — M1990 Unspecified osteoarthritis, unspecified site: Secondary | ICD-10-CM | POA: Diagnosis not present

## 2023-07-18 DIAGNOSIS — E782 Mixed hyperlipidemia: Secondary | ICD-10-CM | POA: Diagnosis not present

## 2023-07-18 DIAGNOSIS — H353290 Exudative age-related macular degeneration, unspecified eye, stage unspecified: Secondary | ICD-10-CM | POA: Insufficient documentation

## 2023-07-18 DIAGNOSIS — N182 Chronic kidney disease, stage 2 (mild): Secondary | ICD-10-CM

## 2023-07-18 DIAGNOSIS — I10 Essential (primary) hypertension: Secondary | ICD-10-CM

## 2023-07-18 DIAGNOSIS — D649 Anemia, unspecified: Secondary | ICD-10-CM

## 2023-07-18 MED ORDER — MELOXICAM 7.5 MG PO TABS: 7.5000 mg | ORAL_TABLET | Freq: Every day | ORAL | 0 refills | Status: DC | PRN

## 2023-07-18 MED ORDER — LOSARTAN POTASSIUM 25 MG PO TABS: 25.0000 mg | ORAL_TABLET | Freq: Every day | ORAL | 0 refills | Status: DC

## 2023-07-18 NOTE — Addendum Note (Signed)
Addended by: Jacquenette Shone on: 07/18/2023 10:47 AM   Modules accepted: Level of Service

## 2023-07-18 NOTE — Assessment & Plan Note (Signed)
Starting ARB today. Continue to monitor.

## 2023-07-18 NOTE — Assessment & Plan Note (Signed)
Patient declined statin. Continue to manage with diet and exercise.

## 2023-07-18 NOTE — Assessment & Plan Note (Signed)
Patient experienced significant relief when taking a half tablet of her husband's 15mg  meloxicam tablets. Will start PRN meloxicam as noted below. Cautioned patient to avoid taking it every day if avoidable to reduce potential harm to her kidneys.

## 2023-07-18 NOTE — Assessment & Plan Note (Addendum)
Receiving Eylea injections in the right eye; frequency varies. Defer to ophthalmology.

## 2023-07-18 NOTE — Assessment & Plan Note (Signed)
Hair loss over crown of head. Managed by dermatology.  Patient requested TSH be checked as dermatologist intended to as possible source of hair loss.

## 2023-07-18 NOTE — Assessment & Plan Note (Signed)
No acute concerns.  Continue to monitor.

## 2023-07-18 NOTE — Patient Instructions (Addendum)
Recommended vaccines: Tdap (tetanus), Shingrix (shingles) and RSV.

## 2023-07-18 NOTE — Assessment & Plan Note (Signed)
BP elevated today Will start losartan 25 mg daily. Discussed daily checks at home and RTC in2-3 weeks or sending a log of her BPs if she is unable to come in due to traveling.

## 2023-07-18 NOTE — Progress Notes (Signed)
Established patient visit   Patient: Kathleen Glenn   DOB: 09-10-1945   77 y.o. Female  MRN: [id number] Visit Date: 07/18/2023  Today's healthcare provider: Sherlyn Hay, DO   Chief Complaint  Patient presents with   Medical Management of Chronic Issues   Subjective    HPI Last annual exam and mAWV: 11/30/2022 Vaccines? Discuss ASCVD 27.6  The patient, with a history of hypertension and arthritis, presents for a routine follow-up of chronic conditions. She reports a recent increase in blood pressure readings at home, now consistently in the 140s over 80s, up from her usual readings in the 130s.   She has been self-medicating with meloxicam, borrowed from her spouse, for arthritis pain in her feet. She reports significant relief from the medication, but expresses concern about potential side effects. The patient has been taking half a dose of a 15mg  meloxicam tablet for the past couple of weeks.  The patient also reports hair loss, particularly in the crown area, and has been advised by a dermatologist to check thyroid function before starting any medication for the hair loss. She has a history of macular degeneration and is currently on Preservision, an eye vitamin, and receives Eylea injections in the right eye every six weeks for the same.  The patient has a history of multiple surgeries, including a hernia surgery that was causing pressure on her organs. She reports no current abdominal pain, nausea, or vomiting. She also reports an old ankle injury from the 1980s that has been causing increased discomfort recently, along with arthritis in her toes causing her toes to bend and resulting in persistent pain.  She reports no pain in her hands, but does note visible signs of arthritis. She has not been tested for rheumatoid arthritis but was told by a previous doctor that her symptoms in her feet were consistent with osteoarthritis.  The patient also mentions a recent severe  cough that lasted several weeks, which she describes as the worst she's ever had. She tested negative for COVID-19 during this time.   She also reports increased itching, which she is unsure if it is related to her current medication, triamterene.  The patient acknowledges a high-stress level due to uncontrollable life situations and is considering asking for medication to help manage this. She has not previously taken any medication for stress or anxiety. She is considering getting the shingles vaccine and the RSV vaccine.     Medications: Outpatient Medications Prior to Visit  Medication Sig   Aflibercept (EYLEA IO) Inject 1 each into the eye every 6 (six) weeks.   Biotin 1 MG CAPS Take by mouth daily.    Calcium Carbonate-Vit D-Min (CALCIUM 600+D PLUS MINERALS) 600-400 MG-UNIT TABS Take 1 tablet by mouth daily.   magnesium oxide (MAG-OX) 400 MG tablet Take 400 mg by mouth daily.   Multiple Vitamins-Minerals (PRESERVISION AREDS 2) CAPS Take by mouth.   triamterene-hydrochlorothiazide (MAXZIDE-25) 37.5-25 MG tablet TAKE 1 TABLET BY MOUTH EVERY DAY   [DISCONTINUED] benzonatate (TESSALON) 100 MG capsule Take 1 capsule (100 mg total) by mouth 2 (two) times daily as needed for cough.   No facility-administered medications prior to visit.    Review of Systems  Constitutional:  Negative for appetite change, chills, fatigue and fever.  Respiratory:  Negative for chest tightness and shortness of breath.   Cardiovascular:  Negative for chest pain and palpitations.  Gastrointestinal:  Negative for abdominal pain, nausea and vomiting.  Endocrine:       +  hair loss  Musculoskeletal:  Positive for arthralgias.  Neurological:  Negative for weakness.  Psychiatric/Behavioral:  The patient is nervous/anxious.         Objective    BP (!) 159/94 (BP Location: Right Arm, Patient Position: Sitting, Cuff Size: Large)   Pulse 81   Temp 98.2 F (36.8 C)   Resp 18   Ht 5\' 3"  (1.6 m)   Wt 185 lb  (83.9 kg) Comment: per patient  SpO2 100%   BMI 32.77 kg/m    The 10-year ASCVD risk score (Arnett DK, et al., 2019) is: 33.8%   Values used to calculate the score:     Age: 43 years     Sex: Female     Is Non-Hispanic African American: No     Diabetic: No     Tobacco smoker: No     Systolic Blood Pressure: 159 mmHg     Is BP treated: Yes     HDL Cholesterol: 60 mg/dL     Total Cholesterol: 198 mg/dL   Physical Exam Constitutional:      Appearance: Normal appearance.  HENT:     Head: Normocephalic and atraumatic.  Eyes:     General: No scleral icterus.    Extraocular Movements: Extraocular movements intact.     Conjunctiva/sclera: Conjunctivae normal.  Cardiovascular:     Rate and Rhythm: Normal rate and regular rhythm.     Pulses: Normal pulses.     Heart sounds: Normal heart sounds.  Pulmonary:     Effort: Pulmonary effort is normal. No respiratory distress.     Breath sounds: Normal breath sounds.  Abdominal:     General: Bowel sounds are normal.  Musculoskeletal:     Right lower leg: No edema.     Left lower leg: No edema.  Skin:    General: Skin is warm and dry.  Neurological:     Mental Status: She is alert and oriented to person, place, and time. Mental status is at baseline.  Psychiatric:        Mood and Affect: Mood normal.        Behavior: Behavior normal.      No results found for any visits on 07/18/23.  Assessment & Plan    Essential hypertension Assessment & Plan: BP elevated today Will start losartan 25 mg daily. Discussed daily checks at home and RTC in2-3 weeks or sending a log of her BPs if she is unable to come in due to traveling.   Orders: -     Comprehensive metabolic panel -     CBC with Differential/Platelet -     Losartan Potassium; Take 1 tablet (25 mg total) by mouth daily.  Dispense: 30 tablet; Refill: 0  Prediabetes Assessment & Plan: No acute concerns.  Continue to monitor.  Orders: -     Hemoglobin A1c -     CBC with  Differential/Platelet  Moderate mixed hyperlipidemia not requiring statin therapy Assessment & Plan: Patient declined statin. Continue to manage with diet and exercise.   Anemia, unspecified type Assessment & Plan: No acute concerns.  Continue to monitor.  Orders: -     CBC with Differential/Platelet  Chronic kidney disease, stage 2, mildly decreased GFR Assessment & Plan: Starting ARB today. Continue to monitor.   Bilateral exudative age-related macular degeneration, unspecified stage [facility]) Assessment & Plan: Receiving Eylea injections in the right eye; frequency varies. Defer to ophthalmology.    Arthritis Assessment & Plan: Patient experienced significant relief when  taking a half tablet of her husband's 15mg  meloxicam tablets. Will start PRN meloxicam as noted below. Cautioned patient to avoid taking it every day if avoidable to reduce potential harm to her kidneys.  Orders: -     Meloxicam; Take 1 tablet (7.5 mg total) by mouth daily as needed for pain.  Dispense: 30 tablet; Refill: 0 -     Rheumatoid factor  Hair loss Assessment & Plan: Hair loss over crown of head. Managed by dermatology.  Patient requested TSH be checked as dermatologist intended to as possible source of hair loss.  Orders: -     TSH Rfx on Abnormal to Free T4  Stress at home Assessment & Plan: Patient feels she managing ok for the time being; she will contact clinic if she would like to address with medication.    Return in about 3 weeks (around 08/08/2023) for HTN.      I discussed the assessment and treatment plan with the patient  The patient was provided an opportunity to ask questions and all were answered. The patient agreed with the plan and demonstrated an understanding of the instructions.   The patient was advised to call back or seek an in-person evaluation if the symptoms worsen or if the condition fails to improve as anticipated.    Sherlyn Hay, DO  [facility]  [facility] [phone] (phone) [phone] (fax)  [facility]

## 2023-07-18 NOTE — Assessment & Plan Note (Signed)
Patient feels she managing ok for the time being; she will contact clinic if she would like to address with medication.

## 2023-07-19 LAB — CBC WITH DIFFERENTIAL/PLATELET
Basophils Absolute: 0 10*3/uL (ref 0.0–0.2)
Basos: 0 %
EOS (ABSOLUTE): 0.1 10*3/uL (ref 0.0–0.4)
Eos: 1 %
Hematocrit: 40.6 % (ref 34.0–46.6)
Hemoglobin: 13.6 g/dL (ref 11.1–15.9)
Immature Grans (Abs): 0 10*3/uL (ref 0.0–0.1)
Immature Granulocytes: 0 %
Lymphocytes Absolute: 0.9 10*3/uL (ref 0.7–3.1)
Lymphs: 13 %
MCH: 31.4 pg (ref 26.6–33.0)
MCHC: 33.5 g/dL (ref 31.5–35.7)
MCV: 94 fL (ref 79–97)
Monocytes Absolute: 0.5 10*3/uL (ref 0.1–0.9)
Monocytes: 6 %
Neutrophils Absolute: 5.7 10*3/uL (ref 1.4–7.0)
Neutrophils: 80 %
Platelets: 351 10*3/uL (ref 150–450)
RBC: 4.33 x10E6/uL (ref 3.77–5.28)
RDW: 12.9 % (ref 11.7–15.4)
WBC: 7.2 10*3/uL (ref 3.4–10.8)

## 2023-07-19 LAB — COMPREHENSIVE METABOLIC PANEL
ALT: 11 [IU]/L (ref 0–32)
AST: 16 [IU]/L (ref 0–40)
Albumin: 4.1 g/dL (ref 3.8–4.8)
Alkaline Phosphatase: 94 [IU]/L (ref 44–121)
BUN/Creatinine Ratio: 22 (ref 12–28)
BUN: 21 mg/dL (ref 8–27)
Bilirubin Total: 0.4 mg/dL (ref 0.0–1.2)
CO2: 24 mmol/L (ref 20–29)
Calcium: 9.9 mg/dL (ref 8.7–10.3)
Chloride: 99 mmol/L (ref 96–106)
Creatinine, Ser: 0.95 mg/dL (ref 0.57–1.00)
Globulin, Total: 3.5 g/dL (ref 1.5–4.5)
Glucose: 105 mg/dL — ABNORMAL HIGH (ref 70–99)
Potassium: 4.1 mmol/L (ref 3.5–5.2)
Sodium: 141 mmol/L (ref 134–144)
Total Protein: 7.6 g/dL (ref 6.0–8.5)
eGFR: 62 mL/min/{1.73_m2} (ref >59)

## 2023-07-19 LAB — TSH RFX ON ABNORMAL TO FREE T4: TSH: 1.29 u[IU]/mL (ref 0.450–4.500)

## 2023-07-19 LAB — RHEUMATOID FACTOR: Rheumatoid fact SerPl-aCnc: <10 [IU]/mL (ref <14.0)

## 2023-07-19 LAB — HEMOGLOBIN A1C
Est. average glucose Bld gHb Est-mCnc: 123 mg/dL
Hgb A1c MFr Bld: 5.9 % — ABNORMAL HIGH (ref 4.8–5.6)

## 2023-08-09 ENCOUNTER — Other Ambulatory Visit: Payer: Self-pay | Admitting: Family Medicine

## 2023-08-09 DIAGNOSIS — I10 Essential (primary) hypertension: Secondary | ICD-10-CM

## 2023-08-10 ENCOUNTER — Encounter: Payer: Self-pay | Admitting: Family Medicine

## 2023-08-14 ENCOUNTER — Other Ambulatory Visit: Payer: Self-pay | Admitting: Family Medicine

## 2023-08-14 DIAGNOSIS — M1990 Unspecified osteoarthritis, unspecified site: Secondary | ICD-10-CM

## 2023-09-01 DIAGNOSIS — H353211 Exudative age-related macular degeneration, right eye, with active choroidal neovascularization: Secondary | ICD-10-CM | POA: Diagnosis not present

## 2023-09-25 ENCOUNTER — Ambulatory Visit (INDEPENDENT_AMBULATORY_CARE_PROVIDER_SITE_OTHER): Payer: HMO | Admitting: Dermatology

## 2023-09-25 ENCOUNTER — Ambulatory Visit: Payer: Medicare HMO | Admitting: Dermatology

## 2023-09-25 DIAGNOSIS — L570 Actinic keratosis: Secondary | ICD-10-CM

## 2023-09-25 DIAGNOSIS — L578 Other skin changes due to chronic exposure to nonionizing radiation: Secondary | ICD-10-CM

## 2023-09-25 DIAGNOSIS — W908XXA Exposure to other nonionizing radiation, initial encounter: Secondary | ICD-10-CM | POA: Diagnosis not present

## 2023-09-25 DIAGNOSIS — L649 Androgenic alopecia, unspecified: Secondary | ICD-10-CM | POA: Diagnosis not present

## 2023-09-25 NOTE — Progress Notes (Signed)
Follow-Up Visit   Subjective  Kathleen Glenn is a 78 y.o. female who presents for the following: AK f/u, scalp, Androgenetic Alopecia scalp, no treatment at this time, TSH test 07/18/23 wnl The patient has spots, moles and lesions to be evaluated, some may be new or changing and the patient may have concern these could be cancer.   The following portions of the chart were reviewed this encounter and updated as appropriate: medications, allergies, medical history  Review of Systems:  No other skin or systemic complaints except as noted in HPI or Assessment and Plan.  Objective  Well appearing patient in no apparent distress; mood and affect are within normal limits.   A focused examination was performed of the following areas: scalp  Relevant exam findings are noted in the Assessment and Plan.  post crown scalp x 7 (7) Pink scaly macules  Assessment & Plan   ANDROGENETIC ALOPECIA (FEMALE PATTERN HAIR LOSS) scalp Exam: Diffuse thinning of the crown and widening of the midline part with retention of the frontal hairline  Chronic and persistent condition with duration or expected duration over one year. Condition is symptomatic / bothersome to patient. Not to goal.   Female Androgenic Alopecia is a chronic condition related to genetics and/or hormonal changes.  In women androgenetic alopecia is commonly associated with menopause but may occur any time after puberty.  It causes hair thinning primarily on the crown with widening of the part and temporal hairline recession.  Can use OTC Rogaine (minoxidil) 5% solution/foam as directed.  Oral treatments in female patients who have no contraindication may include : - Low dose oral minoxidil 1.25 - 5mg  daily - Spironolactone 50 - 100mg  bid - Finasteride 2.5 - 5 mg daily Adjunctive therapies include: - Low Level Laser Light Therapy (LLLT) - Platelet-rich plasma injections (PRP) - Hair Transplants or scalp reduction   Treatment  Plan: Discussed PO Minoxidil in detail, PO Finasteride, pt defers oral treatment at this time, may consider starting next visit. Discussed otc Rogaine, not interested in topical treatment  Doses of minoxidil for hair loss are considered 'low dose'. This is because the doses used for hair loss are much lower than the doses which are used for conditions such as high blood pressure (hypertension). The doses used for hypertension are 10-40mg  per day.  Side effects are uncommon at the low doses (up to 2.5 mg/day) used to treat hair loss. Potential side effects, more commonly seen at higher doses, include: Increase in hair growth (hypertrichosis) elsewhere on face and body Temporary hair shedding upon starting medication which may last up to 4 weeks Ankle swelling, fluid retention, rapid weight gain more than 5 pounds Low blood pressure and feeling lightheaded or dizzy when standing up quickly Fast or irregular heartbeat Headaches    AK (ACTINIC KERATOSIS) (7) post crown scalp x 7 (7) Actinic keratoses are precancerous spots that appear secondary to cumulative UV radiation exposure/sun exposure over time. They are chronic with expected duration over 1 year. A portion of actinic keratoses will progress to squamous cell carcinoma of the skin. It is not possible to reliably predict which spots will progress to skin cancer and so treatment is recommended to prevent development of skin cancer.  Recommend daily broad spectrum sunscreen SPF 30+ to sun-exposed areas, reapply every 2 hours as needed.  Recommend staying in the shade or wearing long sleeves, sun glasses (UVA+UVB protection) and wide brim hats (4-inch brim around the entire circumference of the hat). Call  for new or changing lesions. Destruction of lesion - post crown scalp x 7 (7)  Destruction method: cryotherapy   Informed consent: discussed and consent obtained   Lesion destroyed using liquid nitrogen: Yes   Region frozen until ice ball  extended beyond lesion: Yes   Outcome: patient tolerated procedure well with no complications   Post-procedure details: wound care instructions given   Additional details:  Prior to procedure, discussed risks of blister formation, small wound, skin dyspigmentation, or rare scar following cryotherapy. Recommend Vaseline ointment to treated areas while healing.   ACTINIC DAMAGE - chronic, secondary to cumulative UV radiation exposure/sun exposure over time - diffuse scaly erythematous macules with underlying dyspigmentation - Recommend daily broad spectrum sunscreen SPF 30+ to sun-exposed areas, reapply every 2 hours as needed.  - Recommend staying in the shade or wearing long sleeves, sun glasses (UVA+UVB protection) and wide brim hats (4-inch brim around the entire circumference of the hat). - Call for new or changing lesions.   Return in about 4 months (around 01/23/2024) for TBSE, AK f/u, Hx of BCC.  I, Ardis Rowan, RMA, am acting as scribe for Willeen Niece, MD .   Documentation: I have reviewed the above documentation for accuracy and completeness, and I agree with the above.  Willeen Niece, MD

## 2023-09-25 NOTE — Patient Instructions (Addendum)
Cryotherapy Aftercare  Wash gently with soap and water everyday.   Apply Vaseline and Band-Aid daily until healed.   Female Androgenic Alopecia is a chronic condition related to genetics and/or hormonal changes.  In women androgenetic alopecia is commonly associated with menopause but may occur any time after puberty.  It causes hair thinning primarily on the crown with widening of the part and temporal hairline recession.  Can use OTC Rogaine (minoxidil) 5% solution/foam as directed.  Oral treatments in female patients who have no contraindication may include : - Low dose oral minoxidil 1.25 - 5mg  daily - Spironolactone 50 - 100mg  bid - Finasteride 2.5 - 5 mg daily Adjunctive therapies include: - Low Level Laser Light Therapy (LLLT) - Platelet-rich plasma injections (PRP) - Hair Transplants or scalp reduction    Due to recent changes in healthcare laws, you may see results of your pathology and/or laboratory studies on MyChart before the doctors have had a chance to review them. We understand that in some cases there may be results that are confusing or concerning to you. Please understand that not all results are received at the same time and often the doctors may need to interpret multiple results in order to provide you with the best plan of care or course of treatment. Therefore, we ask that you please give [place] 2 business days to thoroughly review all your results before contacting the office for clarification. Should we see a critical lab result, you will be contacted sooner.   If You Need Anything After Your Visit  If you have any questions or concerns for your doctor, please call our main line at [phone] and press option 4 to reach your doctor's medical assistant. If no one answers, please leave a voicemail as directed and we will return your call as soon as possible. Messages left after 4 pm will be answered the following business day.   You may also send [place] a message via MyChart.  We typically respond to MyChart messages within 1-2 business days.  For prescription refills, please ask your pharmacy to contact our office. Our fax number is [phone].  If you have an urgent issue when the clinic is closed that cannot wait until the next business day, you can page your doctor at the number below.    Please note that while we do our best to be available for urgent issues outside of office hours, we are not available 24/7.   If you have an urgent issue and are unable to reach [place], you may choose to seek medical care at your doctor's office, retail clinic, urgent care center, or emergency room.  If you have a medical emergency, please immediately call 911 or go to the emergency department.  Pager Numbers  - Dr. Gwen Pounds: [phone]  - Dr. Roseanne Reno: [phone]  - Dr. Katrinka Blazing: [phone]   In the event of inclement weather, please call our main line at [phone] for an update on the status of any delays or closures.  Dermatology Medication Tips: Please keep the boxes that topical medications come in in order to help keep track of the instructions about where and how to use these. Pharmacies typically print the medication instructions only on the boxes and not directly on the medication tubes.   If your medication is too expensive, please contact our office at [phone] option 4 or send [place] a message through MyChart.   We are unable to tell what your co-pay for medications will be in advance as this  is different depending on your insurance coverage. However, we may be able to find a substitute medication at lower cost or fill out paperwork to get insurance to cover a needed medication.   If a prior authorization is required to get your medication covered by your insurance company, please allow [place] 1-2 business days to complete this process.  Drug prices often vary depending on where the prescription is filled and some pharmacies may offer cheaper prices.  The  website www.goodrx.com contains coupons for medications through different pharmacies. The prices here do not account for what the cost may be with help from insurance (it may be cheaper with your insurance), but the website can give you the price if you did not use any insurance.  - You can print the associated coupon and take it with your prescription to the pharmacy.  - You may also stop by our office during regular business hours and pick up a GoodRx coupon card.  - If you need your prescription sent electronically to a different pharmacy, notify our office through [facility] or by phone at [phone] option 4.     Si Usted Necesita Algo Despus de Su Visita  Tambin puede enviarnos un mensaje a travs de Clinical cytogeneticist. Por lo general respondemos a los mensajes de MyChart en el transcurso de 1 a 2 das hbiles.  Para renovar recetas, por favor pida a su farmacia que se ponga en contacto con nuestra oficina. Annie Sable de fax es [place] [phone].  Si tiene un asunto urgente cuando la clnica est cerrada y que no puede esperar hasta el siguiente da hbil, puede llamar/localizar a su doctor(a) al nmero que aparece a continuacin.   Por favor, tenga en cuenta que aunque hacemos todo lo posible para estar disponibles para asuntos urgentes fuera del horario de [place], no estamos disponibles las 24 horas del da, los 7 [address]  [address] de la [place].   Si tiene un problema urgente y no puede comunicarse con nosotros, puede optar por buscar atencin mdica  en el consultorio de su doctor(a), en una clnica privada, en un centro de atencin urgente o en una sala de emergencias.  Si tiene Engineer, drilling, por favor llame inmediatamente al 911 o vaya a la sala de emergencias.  Nmeros de bper  - Dr. Gwen Pounds: [phone]  - Dra. Roseanne Reno: [phone]  - Dr. Katrinka Blazing: [phone]   En caso de inclemencias del tiempo, por favor llame a Lacy Duverney principal al [phone] para una  actualizacin sobre el [place] de cualquier retraso o cierre.  Consejos para la medicacin en dermatologa: Por favor, guarde las cajas en las que vienen los medicamentos de uso tpico para ayudarle a seguir las instrucciones sobre dnde y cmo usarlos. Las farmacias generalmente imprimen las instrucciones del medicamento slo en las cajas y no directamente en los tubos del [place].   Si su medicamento es muy caro, por favor, pngase en contacto con Rolm Gala llamando al [phone] y presione la opcin 4 o envenos un mensaje a travs de Clinical cytogeneticist.   No podemos decirle cul ser su copago por los medicamentos por adelantado ya que esto es diferente dependiendo de la cobertura de su seguro. Sin embargo, es posible que podamos encontrar un medicamento sustituto a Audiological scientist un formulario para que el seguro cubra el medicamento que se considera necesario.   Si se requiere una autorizacin previa para que su compaa de seguros [place] su medicamento, por favor permtanos de 1 a 2 das hbiles  para Production designer, theatre/television/film.  Los precios de los medicamentos varan con frecuencia dependiendo del Environmental consultant de dnde se surte la receta y alguna farmacias pueden ofrecer precios ms baratos.  El sitio web www.goodrx.com tiene cupones para medicamentos de Health and safety inspector. Los precios aqu no tienen en cuenta lo que podra costar con la ayuda del seguro (puede ser ms barato con su seguro), pero el sitio web puede darle el precio si no utiliz Tourist information centre manager.  - Puede imprimir el cupn correspondiente y llevarlo con su receta a la farmacia.  - Tambin puede pasar por nuestra oficina durante el horario de atencin regular y Education officer, museum una tarjeta de cupones de GoodRx.  - Si necesita que su receta se enve electrnicamente a una farmacia diferente, informe a nuestra oficina a travs de MyChart de [place] o por telfono llamando al [phone] y presione la opcin 4.

## 2023-09-27 ENCOUNTER — Other Ambulatory Visit: Payer: Self-pay | Admitting: Physician Assistant

## 2023-09-27 DIAGNOSIS — I10 Essential (primary) hypertension: Secondary | ICD-10-CM

## 2023-09-28 ENCOUNTER — Other Ambulatory Visit: Payer: Self-pay | Admitting: Family Medicine

## 2023-09-28 DIAGNOSIS — I10 Essential (primary) hypertension: Secondary | ICD-10-CM

## 2023-09-28 NOTE — Telephone Encounter (Signed)
Medication Refill -  Most Recent Primary Care Visit:  Provider: Sherlyn Hay  Department: [facility]  Visit Type: OFFICE VISIT  Date: 07/18/2023  Medication: triamterene-hydrochlorothiazide (MAXZIDE-25) 37.5-25 MG tablet  Originally prescribed by Alfredia Ferguson, PA-C, pt requesting Dr. Payton Mccallum to take over prescription. Pt not out and has about a week or to left but is requesting change in prescribing provider.   Has the patient contacted their pharmacy? Yes  Is this the correct pharmacy for this prescription? Yes This is the patient's preferred pharmacy:  [id number] - [place], [place] - [address] [place] [address] Phone: [phone] Fax: [phone]   Has the prescription been filled recently? No  Is the patient out of the medication? No  Has the patient been seen for an appointment in the last year OR does the patient have an upcoming appointment? Yes  Can we respond through MyChart? Yes  Agent: Please be advised that Rx refills may take up to 3 business days. We ask that you follow-up with your pharmacy.

## 2023-09-28 NOTE — Telephone Encounter (Signed)
Requested medication (s) are due for refill today: Yes  Requested medication (s) are on the active medication list: Yes  Last refill:  10/10/22  Future visit scheduled: No  Notes to clinic:  Unable to refill per protocol, last refill by another provider. Patient is out of medication.     Requested Prescriptions  Pending Prescriptions Disp Refills   triamterene-hydrochlorothiazide (MAXZIDE-25) 37.5-25 MG tablet 90 tablet 3    Sig: Take 1 tablet by mouth daily.     Cardiovascular: Diuretic Combos Failed - 09/28/2023  4:17 PM      Failed - Last BP in normal range    BP Readings from Last 1 Encounters:  07/18/23 (!) 159/94         Passed - K in normal range and within 180 days    Potassium  Date Value Ref Range Status  07/18/2023 4.1 3.5 - 5.2 mmol/L Final  10/12/2011 3.8 3.5 - 5.1 mmol/L Final         Passed - Na in normal range and within 180 days    Sodium  Date Value Ref Range Status  07/18/2023 141 134 - 144 mmol/L Final  10/12/2011 140 136 - 145 mmol/L Final         Passed - Cr in normal range and within 180 days    Creatinine  Date Value Ref Range Status  10/12/2011 0.78 0.60 - 1.30 mg/dL Final   Creatinine, Ser  Date Value Ref Range Status  07/18/2023 0.95 0.57 - 1.00 mg/dL Final         Passed - Valid encounter within last 6 months    Recent Outpatient Visits           2 months ago Essential hypertension   [facility] Sherlyn Hay, DO   6 months ago Upper respiratory tract infection, unspecified type   [facility] Alfredia Ferguson, PA-C   10 months ago Encounter for annual wellness visit (AWV) in Medicare patient   [facility] [place] Edwards, [place], PA-C   1 year ago Essential hypertension   [place] [facility] Alfredia Ferguson, PA-C   2 years ago Encounter for annual wellness visit (AWV) in Medicare patient   [facility] [place],  Marzella Schlein, MD       Future Appointments             In 3 months Willeen Niece, MD [facility]

## 2023-09-29 ENCOUNTER — Telehealth: Payer: Self-pay

## 2023-09-29 NOTE — Telephone Encounter (Signed)
 Copied from CRM [id number]. Topic: General - Other >> Sep 28, 2023  3:46 PM Kathleen Glenn wrote: Reason for CRM: Pt calling to inform Dr. Payton Mccallum of BP readings. BP doing after starting new medications; for December pt states jer BP was mostly in the 130's/70's, the lowest reading was 117/73, highest was 140/?. Pt took her BP today and it read, 129/74 w/ losartan that was prescribed.

## 2023-10-02 MED ORDER — TRIAMTERENE-HCTZ 37.5-25 MG PO TABS: 1.0000 | ORAL_TABLET | Freq: Every day | ORAL | 0 refills | Status: DC

## 2023-11-03 DIAGNOSIS — H353211 Exudative age-related macular degeneration, right eye, with active choroidal neovascularization: Secondary | ICD-10-CM | POA: Diagnosis not present

## 2023-11-06 ENCOUNTER — Other Ambulatory Visit: Payer: Self-pay | Admitting: Family Medicine

## 2023-11-06 DIAGNOSIS — M1990 Unspecified osteoarthritis, unspecified site: Secondary | ICD-10-CM

## 2023-11-06 DIAGNOSIS — I10 Essential (primary) hypertension: Secondary | ICD-10-CM

## 2023-11-07 NOTE — Telephone Encounter (Signed)
 Requested Prescriptions  Pending Prescriptions Disp Refills   losartan (COZAAR) 25 MG tablet [Pharmacy Med Name: LOSARTAN POTASSIUM 25 MG TAB] 90 tablet 0    Sig: TAKE 1 TABLET (25 MG TOTAL) BY MOUTH DAILY.     Cardiovascular:  Angiotensin Receptor Blockers Failed - 11/07/2023  5:39 PM      Failed - Last BP in normal range    BP Readings from Last 1 Encounters:  07/18/23 (!) 159/94         Passed - Cr in normal range and within 180 days    Creatinine  Date Value Ref Range Status  10/12/2011 0.78 0.60 - 1.30 mg/dL Final   Creatinine, Ser  Date Value Ref Range Status  07/18/2023 0.95 0.57 - 1.00 mg/dL Final         Passed - K in normal range and within 180 days    Potassium  Date Value Ref Range Status  07/18/2023 4.1 3.5 - 5.2 mmol/L Final  10/12/2011 3.8 3.5 - 5.1 mmol/L Final         Passed - Patient is not pregnant      Passed - Valid encounter within last 6 months    Recent Outpatient Visits           3 months ago Essential hypertension   [facility] Sherlyn Hay, DO   7 months ago Upper respiratory tract infection, unspecified type   [facility] Alfredia Ferguson, PA-C   11 months ago Encounter for annual wellness visit (AWV) in Medicare patient   [facility] Edwards, [place], PA-C   1 year ago Essential hypertension   [place] [facility] Alfredia Ferguson, PA-C   2 years ago Encounter for annual wellness visit (AWV) in Medicare patient   [facility] [place] [place], Marzella Schlein, MD       Future Appointments             In 2 months Willeen Niece, MD [facility]             meloxicam (MOBIC) 7.5 MG tablet [Pharmacy Med Name: MELOXICAM 7.5 MG TABLET] 30 tablet 0    Sig: TAKE 1 TABLET BY MOUTH DAILY AS NEEDED FOR PAIN     Analgesics:  COX2 Inhibitors Failed - 11/07/2023  5:39 PM      Failed - Manual Review: Labs are only  required if the patient has taken medication for more than 8 weeks.      Passed - HGB in normal range and within 360 days    Hemoglobin  Date Value Ref Range Status  07/18/2023 13.6 11.1 - 15.9 g/dL Final         Passed - Cr in normal range and within 360 days    Creatinine  Date Value Ref Range Status  10/12/2011 0.78 0.60 - 1.30 mg/dL Final   Creatinine, Ser  Date Value Ref Range Status  07/18/2023 0.95 0.57 - 1.00 mg/dL Final         Passed - HCT in normal range and within 360 days    Hematocrit  Date Value Ref Range Status  07/18/2023 40.6 34.0 - 46.6 % Final         Passed - AST in normal range and within 360 days    AST  Date Value Ref Range Status  07/18/2023 16 0 - 40 IU/L Final  Passed - ALT in normal range and within 360 days    ALT  Date Value Ref Range Status  07/18/2023 11 0 - 32 IU/L Final         Passed - eGFR is 30 or above and within 360 days    EGFR (African American)  Date Value Ref Range Status  10/12/2011 >60 >31mL/min Final   GFR calc Af Amer  Date Value Ref Range Status  01/01/2018 75 >59 mL/min/1.73 Final   EGFR (Non-African Amer.)  Date Value Ref Range Status  10/12/2011 >60 >92mL/min Final    Comment:    eGFR values <65mL/min/1.73 m2 may be an indication of chronic kidney disease (CKD). Calculated eGFR, using the MRDR Study equation, is useful in  patients with stable renal function. The eGFR calculation will not be reliable in acutely ill patients when serum creatinine is changing rapidly. It is not useful in patients on dialysis. The eGFR calculation may not be applicable to patients at the low and high extremes of body sizes, pregnant women, and vegetarians.    GFR, Estimated  Date Value Ref Range Status  04/27/2021 >60 >60 mL/min Final    Comment:    (NOTE) Calculated using the CKD-EPI Creatinine Equation (2021)    eGFR  Date Value Ref Range Status  07/18/2023 62 >59 mL/min/1.73 Final         Passed -  Patient is not pregnant      Passed - Valid encounter within last 12 months    Recent Outpatient Visits           3 months ago Essential hypertension   [facility] [place], Monico Blitz, DO   7 months ago Upper respiratory tract infection, unspecified type   [facility] Alfredia Ferguson, PA-C   11 months ago Encounter for annual wellness visit (AWV) in Medicare patient   [facility] Edwards, [place], PA-C   1 year ago Essential hypertension    [facility] Alfredia Ferguson, PA-C   2 years ago Encounter for annual wellness visit (AWV) in Medicare patient   [facility] [place], Marzella Schlein, MD       Future Appointments             In 2 months Willeen Niece, MD [facility]

## 2023-12-13 ENCOUNTER — Other Ambulatory Visit: Payer: Self-pay | Admitting: Family Medicine

## 2023-12-13 DIAGNOSIS — M1990 Unspecified osteoarthritis, unspecified site: Secondary | ICD-10-CM

## 2023-12-27 DIAGNOSIS — H353211 Exudative age-related macular degeneration, right eye, with active choroidal neovascularization: Secondary | ICD-10-CM | POA: Diagnosis not present

## 2023-12-28 ENCOUNTER — Telehealth: Payer: Self-pay

## 2023-12-28 NOTE — Telephone Encounter (Signed)
 Copied from CRM [id number]. Topic: Clinical - Prescription Issue >> Dec 28, 2023 10:29 AM Phil Braun wrote: Reason for CRM:   losartan  (COZAAR ) 25 MG tablet  Pt stated that she has quit taking this medication because it was causing a lot of pain and cramps. She wants the dr to know and also what she needs to do know.   Please advise.

## 2023-12-31 ENCOUNTER — Other Ambulatory Visit: Payer: Self-pay | Admitting: Family Medicine

## 2023-12-31 DIAGNOSIS — I10 Essential (primary) hypertension: Secondary | ICD-10-CM

## 2024-01-02 NOTE — Telephone Encounter (Signed)
 Patient is past due for an appointment to follow-up BP.

## 2024-01-04 ENCOUNTER — Encounter: Payer: Self-pay | Admitting: Family Medicine

## 2024-01-04 ENCOUNTER — Ambulatory Visit (INDEPENDENT_AMBULATORY_CARE_PROVIDER_SITE_OTHER): Admitting: Family Medicine

## 2024-01-04 VITALS — BP 139/83 | HR 92 | Ht 63.0 in

## 2024-01-04 DIAGNOSIS — F419 Anxiety disorder, unspecified: Secondary | ICD-10-CM | POA: Diagnosis not present

## 2024-01-04 DIAGNOSIS — M791 Myalgia, unspecified site: Secondary | ICD-10-CM | POA: Diagnosis not present

## 2024-01-04 DIAGNOSIS — M79672 Pain in left foot: Secondary | ICD-10-CM

## 2024-01-04 DIAGNOSIS — I10 Essential (primary) hypertension: Secondary | ICD-10-CM | POA: Diagnosis not present

## 2024-01-04 DIAGNOSIS — M79671 Pain in right foot: Secondary | ICD-10-CM

## 2024-01-04 MED ORDER — SERTRALINE HCL 25 MG PO TABS: 25.0000 mg | ORAL_TABLET | Freq: Every day | ORAL | 3 refills | Status: DC

## 2024-01-04 MED ORDER — AMLODIPINE BESYLATE 5 MG PO TABS: 5.0000 mg | ORAL_TABLET | Freq: Every day | ORAL | 2 refills | Status: DC

## 2024-01-04 MED ORDER — HYDROCHLOROTHIAZIDE 25 MG PO TABS: 25.0000 mg | ORAL_TABLET | Freq: Every day | ORAL | 2 refills | Status: DC

## 2024-01-04 NOTE — Assessment & Plan Note (Signed)
 Hypertension management is complicated by muscle pain and weakness attributed to losartan . Home blood pressure readings of 139/83 mmHg indicate suboptimal control. Losartan  was discontinued due to adverse effects, and a calcium channel blocker is preferred over ACE inhibitors due to potential side effects on potassium levels, especially with triamterene  HCTZ. Amlodipine  is chosen for its efficacy and lower risk of muscle pain. - Discontinue losartan  and triamterene  HCTZ. - Prescribe amlodipine  5 mg daily and hydrochlorothiazide  25 mg daily. - Check potassium levels today. - Schedule follow-up in one month to reassess blood pressure management.

## 2024-01-04 NOTE — Progress Notes (Signed)
 Acute visit   Patient: Kathleen Glenn   DOB: May 04, 1946   78 y.o. Female  MRN: [id number] PCP: Mazie Speed, MD  (was [place] - switched per patient preference)  Chief Complaint  Patient presents with   Medical Management of Chronic Issues    5 month follow-up. Patient was to start losartan  25 mg daily   Hypertension    Pt reports stopping losartan  due to being advised in can contribute to muscle aches. Checked BP around lunch time and it was 139/83. No symptoms to report.    Subjective    Discussed the use of AI scribe software for clinical note transcription with the patient, who gave verbal consent to proceed.  History of Present Illness   Kathleen Glenn is a 78 year old female who presents with muscle pain and weakness potentially related to losartan  use.  She stopped losartan  on April 21 due to worsening muscle pain and weakness in her feet and legs, initially attributed to arthritis. Symptoms improved after stopping the medication, but her blood pressure increased, prompting her to resume losartan  by April 25. She discontinued it again on April 30 due to recurrent muscle pain. Home blood pressure readings are 139/83 mmHg, lower than clinic readings. She seeks an alternative antihypertensive medication that does not cause muscle pain.  She has been on triamterene  HCTZ for a long time, starting with hydrochlorothiazide  alone, and questions the necessity of triamterene  due to increased urination and potential itching.  She inquires about using meloxicam  for arthritis pain, noting her husband's daily use without issues, and finds daily use necessary due to persistent pain.  She experiences anxiety and nervousness, particularly at night, affecting her ability to relax and sleep, and seeks a mild medication to manage these symptoms.  She mentions earwax buildup, previously requiring cleaning, and expresses concern about hearing loss post-COVID. She uses Debrox periodically  to manage earwax.  She has a hammer toe and an overlapping toe from an old injury, causing significant foot pain, but is not yet ready to pursue podiatry intervention.          01/04/2024    1:17 PM 07/18/2023    9:28 AM  GAD 7 : Generalized Anxiety Score  Nervous, Anxious, on Edge 1 1  Control/stop worrying 1 1  Worry too much - different things 1 1  Trouble relaxing 1 1  Restless 0 0  Easily annoyed or irritable 0 0  Afraid - awful might happen 1 0  Total GAD 7 Score 5 4  Anxiety Difficulty Not difficult at all Not difficult at all     Review of Systems    Objective    BP 139/83 Comment: home reading  Pulse 92   Ht 5\' 3"  (1.6 m)   SpO2 100%   BMI 32.77 kg/m    Physical Exam Vitals reviewed.  Constitutional:      General: She is not in acute distress.    Appearance: Normal appearance. She is well-developed. She is not diaphoretic.  HENT:     Head: Normocephalic and atraumatic.  Eyes:     General: No scleral icterus.    Conjunctiva/sclera: Conjunctivae normal.  Neck:     Thyroid : No thyromegaly.  Cardiovascular:     Rate and Rhythm: Normal rate and regular rhythm.     Heart sounds: Normal heart sounds. No murmur heard. Pulmonary:     Effort: Pulmonary effort is normal. No respiratory distress.     Breath  sounds: Normal breath sounds. No wheezing, rhonchi or rales.  Musculoskeletal:     Cervical back: Neck supple.     Right lower leg: No edema.     Left lower leg: No edema.  Lymphadenopathy:     Cervical: No cervical adenopathy.  Skin:    General: Skin is warm and dry.     Findings: No rash.  Neurological:     Mental Status: She is alert and oriented to person, place, and time. Mental status is at baseline.  Psychiatric:        Mood and Affect: Mood normal.        Behavior: Behavior normal.       No results found for any visits on 01/04/24.  Assessment & Plan     Problem List Items Addressed This Visit       Cardiovascular and Mediastinum    Essential hypertension - Primary   Hypertension management is complicated by muscle pain and weakness attributed to losartan . Home blood pressure readings of 139/83 mmHg indicate suboptimal control. Losartan  was discontinued due to adverse effects, and a calcium channel blocker is preferred over ACE inhibitors due to potential side effects on potassium levels, especially with triamterene  HCTZ. Amlodipine  is chosen for its efficacy and lower risk of muscle pain. - Discontinue losartan  and triamterene  HCTZ. - Prescribe amlodipine  5 mg daily and hydrochlorothiazide  25 mg daily. - Check potassium levels today. - Schedule follow-up in one month to reassess blood pressure management.      Relevant Medications   hydrochlorothiazide  (HYDRODIURIL ) 25 MG tablet   amLODipine  (NORVASC ) 5 MG tablet   Other Relevant Orders   Basic Metabolic Panel (BMET)     Other   Anxiety   Anxiety is characterized by increased nervousness and rumination at night, affecting relaxation and sleep. Symptoms occur most nights, necessitating regular management. She prefers a mild medication to aid relaxation and sleep. Zoloft  is chosen for its efficacy in managing anxiety with minimal side effects. It may take time to build up in the system and improve symptoms. - Prescribe Zoloft  25 mg daily. - Discuss potential side effects of Zoloft , including nausea and diarrhea, which typically improve after two weeks. - Schedule follow-up in one month to assess response to medication.       Relevant Medications   sertraline  (ZOLOFT ) 25 MG tablet   Other Visit Diagnoses       Myalgia         Foot pain, bilateral               Muscle pain due to losartan  Muscle pain and weakness in feet and legs are attributed to losartan , with symptom improvement upon discontinuation, suggesting a causal relationship. Elevated potassium levels may contribute to symptoms, especially with triamterene  HCTZ. - Discontinue losartan  and triamterene   HCTZ. - Check potassium levels today.      Meds ordered this encounter  Medications   hydrochlorothiazide  (HYDRODIURIL ) 25 MG tablet    Sig: Take 1 tablet (25 mg total) by mouth daily.    Dispense:  30 tablet    Refill:  2   amLODipine  (NORVASC ) 5 MG tablet    Sig: Take 1 tablet (5 mg total) by mouth daily.    Dispense:  30 tablet    Refill:  2   sertraline  (ZOLOFT ) 25 MG tablet    Sig: Take 1 tablet (25 mg total) by mouth daily.    Dispense:  30 tablet    Refill:  3  Return in about 4 weeks (around 02/01/2024) for BP f/u.      Aden Agreste, MD  [facility] [phone] (phone) [phone] (fax)  [facility]

## 2024-01-04 NOTE — Assessment & Plan Note (Signed)
 Anxiety is characterized by increased nervousness and rumination at night, affecting relaxation and sleep. Symptoms occur most nights, necessitating regular management. She prefers a mild medication to aid relaxation and sleep. Zoloft  is chosen for its efficacy in managing anxiety with minimal side effects. It may take time to build up in the system and improve symptoms. - Prescribe Zoloft  25 mg daily. - Discuss potential side effects of Zoloft , including nausea and diarrhea, which typically improve after two weeks. - Schedule follow-up in one month to assess response to medication.

## 2024-01-05 LAB — BASIC METABOLIC PANEL WITH GFR
BUN/Creatinine Ratio: 24 (ref 12–28)
BUN: 21 mg/dL (ref 8–27)
CO2: 23 mmol/L (ref 20–29)
Calcium: 9.4 mg/dL (ref 8.7–10.3)
Chloride: 98 mmol/L (ref 96–106)
Creatinine, Ser: 0.87 mg/dL (ref 0.57–1.00)
Glucose: 94 mg/dL (ref 70–99)
Potassium: 3.9 mmol/L (ref 3.5–5.2)
Sodium: 137 mmol/L (ref 134–144)
eGFR: 69 mL/min/{1.73_m2} (ref >59)

## 2024-01-08 ENCOUNTER — Ambulatory Visit: Payer: Self-pay

## 2024-01-08 ENCOUNTER — Encounter: Payer: Self-pay | Admitting: Family Medicine

## 2024-01-08 NOTE — Telephone Encounter (Signed)
 Increase amlodipine  to 10mg  daily (#30 r1). Continue current hydrochlorothiazide  dose. Continue to monitor BP

## 2024-01-08 NOTE — Telephone Encounter (Signed)
 Pt states that she is asymptomatic but was monitoring her BP at home as she started her BP medications on 5/2. Pt states that her BP has been: Saturday Night 180/90- no Sx Sunday- 136/83, 144/83 Monday - 150/78  Copied from CRM #[id number]. Topic: Clinical - Medical Advice >> Jan 08, 2024  1:47 PM Leory Rands wrote: Reason for CRM: Patient is calling to report that she started amLODipine  (NORVASC ) 5 MG tablet [[id number]] & hydrochlorothiazide  (HYDRODIURIL ) 25 MG tablet [[id number]] on Friday. Reporting BP Saturday Nigh 180/90- no Sx Sunday- 136/83, 144/83 Monday - 150/78 no sx   Wondering if she be concerned or what should she do? Answer Assessment - Initial Assessment Questions 2. ONSET: "When did you take your blood pressure?"     This morning 3. HOW: "How did you take your blood pressure?" (e.g., automatic home BP monitor, visiting nurse)     auto 4. HISTORY: "Do you have a history of high blood pressure?"     Yes, just started antihypertensives 5. MEDICINES: "Are you taking any medicines for blood pressure?" "Have you missed any doses recently?"     Amlodipine , HCTZ 6. OTHER SYMPTOMS: "Do you have any symptoms?" (e.g., blurred vision, chest pain, difficulty breathing, headache, weakness)     Denies s/s  Protocols used: Blood Pressure - High-A-AH

## 2024-01-09 ENCOUNTER — Other Ambulatory Visit: Payer: Self-pay

## 2024-01-09 DIAGNOSIS — I10 Essential (primary) hypertension: Secondary | ICD-10-CM

## 2024-01-09 MED ORDER — AMLODIPINE BESYLATE 10 MG PO TABS: 10.0000 mg | ORAL_TABLET | Freq: Every day | ORAL | 1 refills | Status: DC

## 2024-01-09 NOTE — Addendum Note (Signed)
 Addended by: [place] on: 01/09/2024 09:10 AM   Modules accepted: Orders

## 2024-01-09 NOTE — Telephone Encounter (Signed)
 Pt advised. Verbalized understanding. Rx sent. Was made aware she can double her 5 mg tablets until they are gone and then pick up new rx. She verbalized understanding

## 2024-01-10 ENCOUNTER — Ambulatory Visit: Payer: Self-pay

## 2024-01-10 NOTE — Telephone Encounter (Signed)
 This RN received return call from patient. This RN relayed information from PCP note on previous triage. No further questions at this time.

## 2024-01-10 NOTE — Telephone Encounter (Signed)
 Thanks to the triage RN. Agree with her regarding sitting in a low stress environment and relaxing before taking BP.  Also need to give the medication some more time to work. We will check in as planned in June.

## 2024-01-10 NOTE — Telephone Encounter (Signed)
  Chief Complaint: High BP readings Symptoms: asymptomatic-171/60, 156/90, 131/73 171/86 which is most recent.  Frequency: states she has been checking her pressure several times a day Pertinent Negatives: Patient denies CP, SOB, headache, blurred vision, weakness Disposition: [] ED /[] Urgent Care (no appt availability in office) / [] Appointment(In office/virtual)/ []  [facility]/ [] Home Care/ [] Refused Recommended Disposition /[] [facility]/ [x]  Follow-up with PCP Additional Notes: patient calling to report high BP readings of 171/60 (before meds), 156/90 (lunch time), 131/73 (afternoon), 171/86 (prior to call to triage). Patient endorses she just started the increase dose of Amlodipine  yesterday. Patient reports no symptoms of chest pain, shortness of breath, headache, blurred vision or weakness. Patient endorses anxiety with checking BP readings. Educated patient on high blood pressure readings and symptoms to be concerned. Patient verbalized understanding and states that she felt better. All questions answered. Patient is due for appointment in June with Dr.B.   Copied from CRM [id number]. Topic: Clinical - Red Word Triage >> Jan 10, 2024  2:35 PM Carlatta H wrote: Red Word that prompted transfer to Nurse Triage: Patient was advised to change medication dosage yesterday but her blood pressure is continuing to go up//171/86 Reason for Disposition  [1] Systolic BP  >= 130 OR Diastolic >= 80 AND [2] taking BP medications  Answer Assessment - Initial Assessment Questions 1. BLOOD PRESSURE: "What is the blood pressure?" "Did you take at least two measurements 5 minutes apart?"     171/60 156/90, 131/73, 171/86 2. ONSET: "When did you take your blood pressure?"     Just recently 3. HOW: "How did you take your blood pressure?" (e.g., automatic home BP monitor, visiting nurse)     Automatic home BP monitor 4. HISTORY: "Do you have a history of high blood pressure?"     Yes 5.  MEDICINES: "Are you taking any medicines for blood pressure?" "Have you missed any doses recently?"     Amlodipine  10 mg Daily, hydrochlorothiazide  25 mg  6. OTHER SYMPTOMS: "Do you have any symptoms?" (e.g., blurred vision, chest pain, difficulty breathing, headache, weakness)     yes  Protocols used: Blood Pressure - High-A-AH

## 2024-01-12 ENCOUNTER — Other Ambulatory Visit: Payer: Self-pay | Admitting: Family Medicine

## 2024-01-12 DIAGNOSIS — M1990 Unspecified osteoarthritis, unspecified site: Secondary | ICD-10-CM

## 2024-01-15 ENCOUNTER — Ambulatory Visit: Payer: HMO | Admitting: Dermatology

## 2024-01-15 DIAGNOSIS — M952 Other acquired deformity of head: Secondary | ICD-10-CM | POA: Diagnosis not present

## 2024-01-15 DIAGNOSIS — Z85828 Personal history of other malignant neoplasm of skin: Secondary | ICD-10-CM

## 2024-01-15 DIAGNOSIS — L814 Other melanin hyperpigmentation: Secondary | ICD-10-CM

## 2024-01-15 DIAGNOSIS — D492 Neoplasm of unspecified behavior of bone, soft tissue, and skin: Secondary | ICD-10-CM | POA: Diagnosis not present

## 2024-01-15 DIAGNOSIS — L821 Other seborrheic keratosis: Secondary | ICD-10-CM | POA: Diagnosis not present

## 2024-01-15 DIAGNOSIS — W908XXA Exposure to other nonionizing radiation, initial encounter: Secondary | ICD-10-CM | POA: Diagnosis not present

## 2024-01-15 DIAGNOSIS — M898X9 Other specified disorders of bone, unspecified site: Secondary | ICD-10-CM

## 2024-01-15 DIAGNOSIS — D2262 Melanocytic nevi of left upper limb, including shoulder: Secondary | ICD-10-CM | POA: Diagnosis not present

## 2024-01-15 DIAGNOSIS — L578 Other skin changes due to chronic exposure to nonionizing radiation: Secondary | ICD-10-CM

## 2024-01-15 DIAGNOSIS — Z86018 Personal history of other benign neoplasm: Secondary | ICD-10-CM

## 2024-01-15 DIAGNOSIS — L570 Actinic keratosis: Secondary | ICD-10-CM

## 2024-01-15 DIAGNOSIS — D1801 Hemangioma of skin and subcutaneous tissue: Secondary | ICD-10-CM

## 2024-01-15 DIAGNOSIS — D229 Melanocytic nevi, unspecified: Secondary | ICD-10-CM

## 2024-01-15 DIAGNOSIS — L84 Corns and callosities: Secondary | ICD-10-CM

## 2024-01-15 DIAGNOSIS — Z1283 Encounter for screening for malignant neoplasm of skin: Secondary | ICD-10-CM | POA: Diagnosis not present

## 2024-01-15 DIAGNOSIS — D489 Neoplasm of uncertain behavior, unspecified: Secondary | ICD-10-CM

## 2024-01-15 DIAGNOSIS — D2261 Melanocytic nevi of right upper limb, including shoulder: Secondary | ICD-10-CM

## 2024-01-15 HISTORY — DX: Actinic keratosis: L57.0

## 2024-01-15 NOTE — Patient Instructions (Addendum)
 Biopsy Wound Care Instructions  Leave the original bandage on for 24 hours if possible.  If the bandage becomes soaked or soiled before that time, it is OK to remove it and examine the wound.  A small amount of post-operative bleeding is normal.  If excessive bleeding occurs, remove the bandage, place gauze over the site and apply continuous pressure (no peeking) over the area for 30 minutes. If this does not work, please call our clinic as soon as possible or page your doctor if it is after hours.   Once a day, cleanse the wound with soap and water. It is fine to shower. If a thick crust develops you may use a Q-tip dipped into dilute hydrogen peroxide (mix 1:1 with water) to dissolve it.  Hydrogen peroxide can slow the healing process, so use it only as needed.    After washing, apply petroleum jelly (Vaseline) or an antibiotic ointment if your doctor prescribed one for you, followed by a bandage.    For best healing, the wound should be covered with a layer of ointment at all times. If you are not able to keep the area covered with a bandage to hold the ointment in place, this may mean re-applying the ointment several times a day.  Continue this wound care until the wound has healed and is no longer open.   Itching and mild discomfort is normal during the healing process. However, if you develop pain or severe itching, please call our office.   If you have any discomfort, you can take Tylenol  (acetaminophen ) or ibuprofen  as directed on the bottle. (Please do not take these if you have an allergy to them or cannot take them for another reason).  Some redness, tenderness and white or yellow material in the wound is normal healing.  If the area becomes very sore and red, or develops a thick yellow-green material (pus), it may be infected; please notify us .    If you have stitches, return to clinic as directed to have the stitches removed. You will continue wound care for 2-3 days after the stitches  are removed.   Wound healing continues for up to one year following surgery. It is not unusual to experience pain in the scar from time to time during the interval.  If the pain becomes severe or the scar thickens, you should notify the office.    A slight amount of redness in a scar is expected for the first six months.  After six months, the redness will fade and the scar will soften and fade.  The color difference becomes less noticeable with time.  If there are any problems, return for a post-op surgery check at your earliest convenience.  To improve the appearance of the scar, you can use silicone scar gel, cream, or sheets (such as Mederma or Serica) every night for up to one year. These are available over the counter (without a prescription).  Please call our office at [phone] for any questions or concerns.     For spot at right bottom of foot - if painful recommend podiatrist evaluation   Actinic keratoses are precancerous spots that appear secondary to cumulative UV radiation exposure/sun exposure over time. They are chronic with expected duration over 1 year. A portion of actinic keratoses will progress to squamous cell carcinoma of the skin. It is not possible to reliably predict which spots will progress to skin cancer and so treatment is recommended to prevent development of skin cancer.  Recommend  daily broad spectrum sunscreen SPF 30+ to sun-exposed areas, reapply every 2 hours as needed.  Recommend staying in the shade or wearing long sleeves, sun glasses (UVA+UVB protection) and wide brim hats (4-inch brim around the entire circumference of the hat). Call for new or changing lesions.    Cryotherapy Aftercare  Wash gently with soap and water everyday.   Apply Vaseline and Band-Aid daily until healed.    Seborrheic Keratosis  What causes seborrheic keratoses? Seborrheic keratoses are harmless, common skin growths that first appear during adult life.  As time goes  by, more growths appear.  Some people may develop a large number of them.  Seborrheic keratoses appear on both covered and uncovered body parts.  They are not caused by sunlight.  The tendency to develop seborrheic keratoses can be inherited.  They vary in color from skin-colored to gray, brown, or even black.  They can be either smooth or have a rough, warty surface.   Seborrheic keratoses are superficial and look as if they were stuck on the skin.  Under the microscope this type of keratosis looks like layers upon layers of skin.  That is why at times the top layer may seem to fall off, but the rest of the growth remains and re-grows.    Treatment Seborrheic keratoses do not need to be treated, but can easily be removed in the office.  Seborrheic keratoses often cause symptoms when they rub on clothing or jewelry.  Lesions can be in the way of shaving.  If they become inflamed, they can cause itching, soreness, or burning.  Removal of a seborrheic keratosis can be accomplished by freezing, burning, or surgery. If any spot bleeds, scabs, or grows rapidly, please return to have it checked, as these can be an indication of a skin cancer.  Melanoma ABCDEs  Melanoma is the most dangerous type of skin cancer, and is the leading cause of death from skin disease.  You are more likely to develop melanoma if you: Have light-colored skin, light-colored eyes, or red or blond hair Spend a lot of time in the sun Tan regularly, either outdoors or in a tanning bed Have had blistering sunburns, especially during childhood Have a close family member who has had a melanoma Have atypical moles or large birthmarks  Early detection of melanoma is key since treatment is typically straightforward and cure rates are extremely high if we catch it early.   The first sign of melanoma is often a change in a mole or a new dark spot.  The ABCDE system is a way of remembering the signs of melanoma.  A for asymmetry:  The two  halves do not match. B for border:  The edges of the growth are irregular. C for color:  A mixture of colors are present instead of an even brown color. D for diameter:  Melanomas are usually (but not always) greater than 6mm - the size of a pencil eraser. E for evolution:  The spot keeps changing in size, shape, and color.  Please check your skin once per month between visits. You can use a small mirror in front and a large mirror behind you to keep an eye on the back side or your body.   If you see any new or changing lesions before your next follow-up, please call to schedule a visit.  Please continue daily skin protection including broad spectrum sunscreen SPF 30+ to sun-exposed areas, reapplying every 2 hours as needed when you're outdoors.  Staying in the shade or wearing long sleeves, sun glasses (UVA+UVB protection) and wide brim hats (4-inch brim around the entire circumference of the hat) are also recommended for sun protection.     Due to recent changes in healthcare laws, you may see results of your pathology and/or laboratory studies on MyChart before the doctors have had a chance to review them. We understand that in some cases there may be results that are confusing or concerning to you. Please understand that not all results are received at the same time and often the doctors may need to interpret multiple results in order to provide you with the best plan of care or course of treatment. Therefore, we ask that you please give us  2 business days to thoroughly review all your results before contacting the office for clarification. Should we see a critical lab result, you will be contacted sooner.   If You Need Anything After Your Visit  If you have any questions or concerns for your doctor, please call our main line at [phone] and press option 4 to reach your doctor's medical assistant. If no one answers, please leave a voicemail as directed and we will return your call as  soon as possible. Messages left after 4 pm will be answered the following business day.   You may also send us  a message via MyChart. We typically respond to MyChart messages within 1-2 business days.  For prescription refills, please ask your pharmacy to contact our office. Our fax number is [phone].  If you have an urgent issue when the clinic is closed that cannot wait until the next business day, you can page your doctor at the number below.    Please note that while we do our best to be available for urgent issues outside of office hours, we are not available 24/7.   If you have an urgent issue and are unable to reach us , you may choose to seek medical care at your doctor's office, retail clinic, urgent care center, or emergency room.  If you have a medical emergency, please immediately call 911 or go to the emergency department.  Pager Numbers  - Dr. Bary Likes: [phone]  - Dr. Annette Barters: [phone]  - Dr. Felipe Horton: [phone]   In the event of inclement weather, please call our main line at [phone] for an update on the status of any delays or closures.  Dermatology Medication Tips: Please keep the boxes that topical medications come in in order to help keep track of the instructions about where and how to use these. Pharmacies typically print the medication instructions only on the boxes and not directly on the medication tubes.   If your medication is too expensive, please contact our office at [phone] option 4 or send us  a message through MyChart.   We are unable to tell what your co-pay for medications will be in advance as this is different depending on your insurance coverage. However, we may be able to find a substitute medication at lower cost or fill out paperwork to get insurance to cover a needed medication.   If a prior authorization is required to get your medication covered by your insurance company, please allow us  1-2 business days to complete this  process.  Drug prices often vary depending on where the prescription is filled and some pharmacies may offer cheaper prices.  The website www.goodrx.com contains coupons for medications through different pharmacies. The prices here do not account for what the cost may be with  help from insurance (it may be cheaper with your insurance), but the website can give you the price if you did not use any insurance.  - You can print the associated coupon and take it with your prescription to the pharmacy.  - You may also stop by our office during regular business hours and pick up a GoodRx coupon card.  - If you need your prescription sent electronically to a different pharmacy, notify our office through [facility] or by phone at [phone] option 4.     Si Usted Necesita Algo Despus de Su Visita  Tambin puede enviarnos un mensaje a travs de Clinical cytogeneticist. Por lo general respondemos a los mensajes de MyChart en el transcurso de 1 a 2 das hbiles.  Para renovar recetas, por favor pida a su farmacia que se ponga en contacto con nuestra oficina. Franz Jacks de fax es [place] [phone].  Si tiene un asunto urgente cuando la clnica est cerrada y que no puede esperar hasta el siguiente da hbil, puede llamar/localizar a su doctor(a) al nmero que aparece a continuacin.   Por favor, tenga en cuenta que aunque hacemos todo lo posible para estar disponibles para asuntos urgentes fuera del horario de [place], no estamos disponibles las 24 horas del da, los 7 [address]  [address] de la [place].   Si tiene un problema urgente y no puede comunicarse con nosotros, puede optar por buscar atencin mdica  en el consultorio de su doctor(a), en una clnica privada, en un centro de atencin urgente o en una sala de emergencias.  Si tiene Engineer, drilling, por favor llame inmediatamente al 911 o vaya a la sala de emergencias.  Nmeros de bper  - Dr. Bary Likes: [phone]  - Dra. Annette Barters: [phone]  - Dr.  Felipe Horton: [phone]   En caso de inclemencias del tiempo, por favor llame a Lajuan Pila principal al [phone] para una actualizacin sobre el [place] de cualquier retraso o cierre.  Consejos para la medicacin en dermatologa: Por favor, guarde las cajas en las que vienen los medicamentos de uso tpico para ayudarle a seguir las instrucciones sobre dnde y cmo usarlos. Las farmacias generalmente imprimen las instrucciones del medicamento slo en las cajas y no directamente en los tubos del [place].   Si su medicamento es muy caro, por favor, pngase en contacto con Bettyjane Brunet llamando al [phone] y presione la opcin 4 o envenos un mensaje a travs de Clinical cytogeneticist.   No podemos decirle cul ser su copago por los medicamentos por adelantado ya que esto es diferente dependiendo de la cobertura de su seguro. Sin embargo, es posible que podamos encontrar un medicamento sustituto a Audiological scientist un formulario para que el seguro cubra el medicamento que se considera necesario.   Si se requiere una autorizacin previa para que su compaa de seguros [place] su medicamento, por favor permtanos de 1 a 2 das hbiles para completar este proceso.  Los precios de los medicamentos varan con frecuencia dependiendo del Environmental consultant de dnde se surte la receta y alguna farmacias pueden ofrecer precios ms baratos.  El sitio web www.goodrx.com tiene cupones para medicamentos de Health and safety inspector. Los precios aqu no tienen en cuenta lo que podra costar con la ayuda del seguro (puede ser ms barato con su seguro), pero el sitio web puede darle el precio si no utiliz Tourist information centre manager.  - Puede imprimir el cupn correspondiente y llevarlo con su receta a la farmacia.  - Tambin puede pasar por nuestra oficina durante el  horario de Visual merchandiser regular y Education officer, museum una tarjeta de cupones de GoodRx.  - Si necesita que su receta se enve electrnicamente a una farmacia diferente, informe a nuestra oficina a  travs de MyChart de [place] o por telfono llamando al [phone] y presione la opcin 4.

## 2024-01-15 NOTE — Progress Notes (Signed)
 Follow-Up Visit   Subjective  Kathleen Glenn is a 78 y.o. female who presents for the following: Skin Cancer Screening and Full Body Skin Exam Hx of bcc, hx of isks and aks  Hx of atypical nevus,  Sore spot at right foot, tried Mediplast but didn't help  The patient presents for Total-Body Skin Exam (TBSE) for skin cancer screening and mole check. The patient has spots, moles and lesions to be evaluated, some may be new or changing and the patient may have concern these could be cancer.    The following portions of the chart were reviewed this encounter and updated as appropriate: medications, allergies, medical history  Review of Systems:  No other skin or systemic complaints except as noted in HPI or Assessment and Plan.  Objective  Well appearing patient in no apparent distress; mood and affect are within normal limits.  A full examination was performed including scalp, head, eyes, ears, nose, lips, neck, chest, axillae, abdomen, back, buttocks, bilateral upper extremities, bilateral lower extremities, hands, feet, fingers, toes, fingernails, and toenails. All findings within normal limits unless otherwise noted below.   Relevant physical exam findings are noted in the Assessment and Plan.  mid crown scalp x 1 Pink scaly papule right lower lateral calf 2 x 1.2 cm pink flesh keratotic plaque    Assessment & Plan   SKIN CANCER SCREENING PERFORMED TODAY.  ACTINIC DAMAGE - Chronic condition, secondary to cumulative UV/sun exposure - diffuse scaly erythematous macules with underlying dyspigmentation - Recommend daily broad spectrum sunscreen SPF 30+ to sun-exposed areas, reapply every 2 hours as needed.  - Staying in the shade or wearing long sleeves, sun glasses (UVA+UVB protection) and wide brim hats (4-inch brim around the entire circumference of the hat) are also recommended for sun protection.  - Call for new or changing lesions.  LENTIGINES, SEBORRHEIC KERATOSES,  HEMANGIOMAS - Benign normal skin lesions - Benign-appearing - Call for any changes  MELANOCYTIC NEVI - Tan-brown and/or pink-flesh-colored symmetric macules and papules - Left Posterior Upper Arm- 5.0 mm speckled brown macule - stable compared to previous photo 01/04/2022 - Right antecubital- 3.0 mm medium dark brown macule, lighter center - stable compared to photo 01/04/2022 - Benign appearing on exam today - Observation - Call clinic for new or changing moles    Bony prominence vs Cysts. Exam: 0.9 cm firm nodule at superior forehead and 0.6 cm firm nodule at inferior forehead, Present for years without change per pt   Stable. Benign appearing. Observation.     CALLUS/CORN Exam: firm keratotic papule right lateral plantar foot at bony pressure point   Treatment Plan: Continue using [place]ad Mediplast pads. Cut to fit wart or callus. Cover with Elastoplast waterproof tape or any waterproof band-aid. Change every 3 to 4 days, or sooner if necessary.  Treatment may require several months of regular use before results are seen.  Since painful, recommend pt see a podiatrist    HISTORY OF BASAL CELL CARCINOMA OF THE SKIN See history  - left medial breast (superficial) 02/06/2017 - right chin ( nodular pattern) 02/06/2017 - No evidence of recurrence today - Recommend regular full body skin exams - Recommend daily broad spectrum sunscreen SPF 30+ to sun-exposed areas, reapply every 2 hours as needed.  - Call if any new or changing lesions are noted between office visits   History of Atypical Nevus - No evidence of recurrence today of the left posterior upper arm above elbow - Recommend regular full body  skin exams - Recommend daily broad spectrum sunscreen SPF 30+ to sun-exposed areas, reapply every 2 hours as needed.  - Call if any new or changing lesions are noted between office visits   ACTINIC KERATOSIS mid crown scalp x 1 Vs isk   Actinic keratoses are precancerous spots that  appear secondary to cumulative UV radiation exposure/sun exposure over time. They are chronic with expected duration over 1 year. A portion of actinic keratoses will progress to squamous cell carcinoma of the skin. It is not possible to reliably predict which spots will progress to skin cancer and so treatment is recommended to prevent development of skin cancer.  Recommend daily broad spectrum sunscreen SPF 30+ to sun-exposed areas, reapply every 2 hours as needed.  Recommend staying in the shade or wearing long sleeves, sun glasses (UVA+UVB protection) and wide brim hats (4-inch brim around the entire circumference of the hat). Call for new or changing lesions. Destruction of lesion - mid crown scalp x 1  Destruction method: cryotherapy   Informed consent: discussed and consent obtained   Lesion destroyed using liquid nitrogen: Yes   Region frozen until ice ball extended beyond lesion: Yes   Outcome: patient tolerated procedure well with no complications   Post-procedure details: wound care instructions given   Additional details:  Prior to procedure, discussed risks of blister formation, small wound, skin dyspigmentation, or rare scar following cryotherapy. Recommend Vaseline ointment to treated areas while healing.  NEOPLASM OF UNCERTAIN BEHAVIOR right lower lateral calf Skin / nail biopsy Type of biopsy: tangential   Informed consent: discussed and consent obtained   Patient was prepped and draped in usual sterile fashion: Area prepped with alcohol . Anesthesia: the lesion was anesthetized in a standard fashion   Anesthetic:  1% lidocaine  w/ epinephrine 1-100,000 buffered w/ 8.4% NaHCO3 Instrument used: flexible razor blade   Hemostasis achieved with: pressure, aluminum chloride and electrodesiccation   Outcome: patient tolerated procedure well   Post-procedure details: wound care instructions given   Post-procedure details comment:  Ointment and small bandage applied Specimen 1 -  Surgical pathology Differential Diagnosis: Isk vs porokeratosis r/o scc  Check Margins: No Isk vs porokeratosis r/o scc  Return in about 1 year (around 01/14/2025) for TBSE.    Documentation: I have reviewed the above documentation for accuracy and completeness, and I agree with the above.  Artemio Larry, MD

## 2024-01-18 LAB — SURGICAL PATHOLOGY

## 2024-01-22 ENCOUNTER — Ambulatory Visit: Payer: Self-pay | Admitting: Dermatology

## 2024-01-22 NOTE — Telephone Encounter (Signed)
 Advised patient biopsy of the right lower lateral calf was AK with warty changes. Follow-up scheduled 02/28/24 at [facility] for cryotherapy.

## 2024-01-22 NOTE — Telephone Encounter (Signed)
-----   Message from Artemio Larry sent at 01/22/2024  8:27 AM EDT ----- 1. Skin, right lower lateral calf :       HYPERPLASTIC ACTINIC KERATOSIS WITH WARTY CHANGES   Precancer- needs cryotherapy to remainder of lesion - please call patient

## 2024-01-26 ENCOUNTER — Other Ambulatory Visit: Payer: Self-pay | Admitting: Family Medicine

## 2024-02-02 ENCOUNTER — Other Ambulatory Visit: Payer: Self-pay | Admitting: Family Medicine

## 2024-02-02 DIAGNOSIS — I10 Essential (primary) hypertension: Secondary | ICD-10-CM

## 2024-02-04 ENCOUNTER — Other Ambulatory Visit: Payer: Self-pay | Admitting: Family Medicine

## 2024-02-04 DIAGNOSIS — I10 Essential (primary) hypertension: Secondary | ICD-10-CM

## 2024-02-12 ENCOUNTER — Other Ambulatory Visit: Payer: Self-pay | Admitting: Family Medicine

## 2024-02-12 DIAGNOSIS — M1990 Unspecified osteoarthritis, unspecified site: Secondary | ICD-10-CM

## 2024-02-20 ENCOUNTER — Ambulatory Visit (INDEPENDENT_AMBULATORY_CARE_PROVIDER_SITE_OTHER): Admitting: Family Medicine

## 2024-02-20 ENCOUNTER — Other Ambulatory Visit: Payer: Self-pay | Admitting: Family Medicine

## 2024-02-20 ENCOUNTER — Encounter: Payer: Self-pay | Admitting: Family Medicine

## 2024-02-20 VITALS — BP 151/81 | HR 88 | Ht 63.0 in

## 2024-02-20 DIAGNOSIS — R600 Localized edema: Secondary | ICD-10-CM | POA: Insufficient documentation

## 2024-02-20 DIAGNOSIS — F419 Anxiety disorder, unspecified: Secondary | ICD-10-CM

## 2024-02-20 DIAGNOSIS — I10 Essential (primary) hypertension: Secondary | ICD-10-CM

## 2024-02-20 MED ORDER — AMLODIPINE BESYLATE 5 MG PO TABS
5.0000 mg | ORAL_TABLET | Freq: Every day | ORAL | 1 refills | Status: DC
Start: 2024-02-20 — End: 2024-03-21

## 2024-02-20 MED ORDER — LISINOPRIL 20 MG PO TABS
20.0000 mg | ORAL_TABLET | Freq: Every day | ORAL | 1 refills | Status: DC
Start: 2024-02-20 — End: 2024-03-14

## 2024-02-20 NOTE — Assessment & Plan Note (Signed)
 Patient had discontinued losartan  and been started on 5mg  of amlodipine  while continuing her hydrochlorothiazide . This was not helpful in reducing her hypertension so she was prescribed 10mg  amlodipine . Unfortunately, this did not help her blood pressure much but did give her some significant, bilateral lower extremity edema with associated early stage of stasis dermatitis that has gotten better since its onset a week ago. Given the side effects she has had with amlodipine  and the fact her BP has not been well controlled we elected to go back down to 5mg  amlodipine  and to initiate Lisinopril  20mg . -Lisinopril  20mg  sent in -Instructed to take only 5mg  Amlodipine  -Follow up in one month with labs

## 2024-02-20 NOTE — Progress Notes (Signed)
 Established Patient Office Visit  Subjective   Patient ID: Kathleen Glenn, female    DOB: [birth year]  Age: 78 y.o. MRN: [id number]  Chief Complaint  Patient presents with   Medical Management of Chronic Issues   Hypertension    She was last seen for hypertension 1 months ago.  Management since that visit includes discontinue losartan  and triamterene  hydrochlorothiazide , prescribe amlodipine  5 mg daily and hydrochlorothiazide  25 mg daily. She reports excellent compliance with treatment. She is having side effects. Ankle and feet swelling associated with rash. She is following a low salt diet. She is not exercising due to swelling. She does not smoke. Outside blood pressures are elevated Symptoms: edema   Kathleen Glenn is a 78 y/o female presenting today for blood pressure follow-up. She is feeling okay today and has been taking her medications as prescribed. Unfortunately, since starting the 10mg  amlodipine  she has developed some significant lower extremity edema with the left being more swollen than the right. She noticed this after she switched from 5mg  to the 10mg  and it has been worsening. She also noted a rash around the ankles that developed after her legs became swollen which has slightly improved over the past few days. Additionally, her home Bps have averaged in the 140-150s/80s on her current medication regimen. She denies any shortness of breath or hypotensive episodes.    Past Medical History:  Diagnosis Date   Actinic keratosis    Atypical mole 06/23/2020   L post arm above elbow, excised 08/17/20   Basal cell carcinoma 02/06/2017   Left medial breast. Superficial.    Basal cell carcinoma 02/06/2017   Right chin. Nodular pattern.   Endometrial cancer (HCC) 2013   Hypertension    Morbid obesity (HCC) 12/25/2014   Past Surgical History:  Procedure Laterality Date   ABDOMINAL HYSTERECTOMY  2013   BSO, Tumor Stage 1 Cancer   BILATERAL SALPINGOOPHORECTOMY  2013    CATARACT EXTRACTION  2005   CERVICAL POLYPECTOMY  08/2011   Dr. Nannette Babe   ENDOMETRIAL BIOPSY  08/15/2011   Dr. Nannette Babe;    HYSTEROSCOPY  08/2011   Dr. Nannette Babe   INSERTION OF MESH N/A 04/23/2021   Procedure: INSERTION OF MESH;  Surgeon: Conrado Delay, DO;  Location: [facility] ORS;  Service: General;  Laterality: N/A;   VENTRAL HERNIA REPAIR N/A 04/23/2021   Procedure: HERNIA REPAIR VENTRAL ADULT;  Surgeon: Conrado Delay, DO;  Location: [facility] ORS;  Service: General;  Laterality: N/A;     Objective:     BP (!) 151/81 Comment: home reading  Pulse 88   Ht 5' 3 (1.6 m)   SpO2 100%   BMI 32.77 kg/m  BP Readings from Last 3 Encounters:  02/20/24 (!) 151/81  01/04/24 139/83  07/18/23 (!) 159/94   Wt Readings from Last 3 Encounters:  07/18/23 185 lb (83.9 kg)  11/30/22 189 lb (85.7 kg)  05/18/22 194 lb (88 kg)     Physical Exam Constitutional:      General: She is not in acute distress.    Appearance: Normal appearance. She is not toxic-appearing.  HENT:     Head: Normocephalic and atraumatic.     Right Ear: External ear normal.     Left Ear: External ear normal.     Nose: Nose normal.     Mouth/Throat:     Mouth: Mucous membranes are moist.     Pharynx: Oropharynx is clear. No oropharyngeal exudate.   Eyes:     General: No  scleral icterus.    Extraocular Movements: Extraocular movements intact.     Pupils: Pupils are equal, round, and reactive to light.    Cardiovascular:     Rate and Rhythm: Normal rate and regular rhythm.     Pulses: Normal pulses.     Heart sounds: Normal heart sounds.  Pulmonary:     Effort: Pulmonary effort is normal. No respiratory distress.     Breath sounds: Normal breath sounds. No wheezing or rales.  Abdominal:     General: There is no distension.     Palpations: Abdomen is soft.   Musculoskeletal:        General: Swelling present. Normal range of motion.     Cervical back: Normal range of motion.     Right lower leg: Edema  present.     Left lower leg: Edema present.     Comments: Bilateral pitting edema present from midshin to feet with left worse than right.   Skin:    General: Skin is warm and dry.     Coloration: Skin is not jaundiced.     Findings: Rash present. No erythema.   Neurological:     General: No focal deficit present.     Mental Status: She is alert and oriented to person, place, and time. Mental status is at baseline.     Cranial Nerves: No cranial nerve deficit.     Motor: No weakness.   Psychiatric:        Mood and Affect: Mood normal.        Behavior: Behavior normal.     No results found for any visits on 02/20/24.  Last CBC Lab Results  Component Value Date   WBC 7.2 07/18/2023   HGB 13.6 07/18/2023   HCT 40.6 07/18/2023   MCV 94 07/18/2023   MCH 31.4 07/18/2023   RDW 12.9 07/18/2023   PLT 351 07/18/2023   Last metabolic panel Lab Results  Component Value Date   GLUCOSE 94 01/04/2024   NA 137 01/04/2024   K 3.9 01/04/2024   CL 98 01/04/2024   CO2 23 01/04/2024   BUN 21 01/04/2024   CREATININE 0.87 01/04/2024   EGFR 69 01/04/2024   CALCIUM 9.4 01/04/2024   PHOS 3.2 04/27/2021   PROT 7.6 07/18/2023   ALBUMIN 4.1 07/18/2023   LABGLOB 3.5 07/18/2023   AGRATIO 1.3 11/30/2022   BILITOT 0.4 07/18/2023   ALKPHOS 94 07/18/2023   AST 16 07/18/2023   ALT 11 07/18/2023   ANIONGAP 6 04/27/2021   Last lipids Lab Results  Component Value Date   CHOL 198 11/30/2022   HDL 60 11/30/2022   LDLCALC 125 (H) 11/30/2022   TRIG 70 11/30/2022   CHOLHDL 3.3 11/30/2022   Last thyroid  functions Lab Results  Component Value Date   TSH 1.290 07/18/2023      The 10-year ASCVD risk score (Arnett DK, et al., 2019) is: 31%    Assessment & Plan:   Problem List Items Addressed This Visit       Cardiovascular and Mediastinum   Essential hypertension   Patient had discontinued losartan  and been started on 5mg  of amlodipine  while continuing her hydrochlorothiazide . This  was not helpful in reducing her hypertension so she was prescribed 10mg  amlodipine . Unfortunately, this did not help her blood pressure much but did give her some significant, bilateral lower extremity edema with associated early stage of stasis dermatitis that has gotten better since its onset a week ago. Given the side effects  she has had with amlodipine  and the fact her BP has not been well controlled we elected to go back down to 5mg  amlodipine  and to initiate Lisinopril  20mg . -Lisinopril  20mg  sent in -Instructed to take only 5mg  Amlodipine  -Follow up in one month with labs      Relevant Medications   lisinopril  (ZESTRIL ) 20 MG tablet   amLODipine  (NORVASC ) 5 MG tablet     Other   Anxiety   Zoloft  has been helpful in reducing the patient's anxiety especially at night. She has been taking it every night to good effect and has been tolerating the medication well. She does not wish to increase the dose at this time. -Continue Zoloft  25mg       Bilateral lower extremity edema - Primary   Patient had discontinued losartan  and been started on 5mg  of amlodipine  while continuing her hydrochlorothiazide . This was not helpful in reducing her hypertension so she was prescribed 10mg  amlodipine . Unfortunately, this did not help her blood pressure much but did give her some significant, bilateral lower extremity edema with associated early stage of stasis dermatitis that has gotten better since its onset a week ago. Given the side effects she has had with amlodipine  and the fact her BP has not been well controlled we elected to go back down to 5mg  amlodipine  and to initiate Lisinopril  20mg . -Lisinopril  20mg  sent in -Instructed to take only 5mg  Amlodipine  -Follow up in one month with labs       Return in about 4 weeks (around 03/19/2024) for BP f/u.    Monda Angry, Medical Student   Patient seen along with MS3 student, Luther Saltness. I personally evaluated this patient along with the student,  and verified all aspects of the history, physical exam, and medical decision making as documented by the student. I agree with the student's documentation and have made all necessary edits.  Ashani Pumphrey, Stan Eans, MD, MPH [facility]

## 2024-02-20 NOTE — Assessment & Plan Note (Signed)
 Zoloft  has been helpful in reducing the patient's anxiety especially at night. She has been taking it every night to good effect and has been tolerating the medication well. She does not wish to increase the dose at this time. -Continue Zoloft  25mg 

## 2024-02-20 NOTE — Assessment & Plan Note (Addendum)
 Patient had discontinued losartan  and been started on 5mg  of amlodipine  while continuing her hydrochlorothiazide . This was not helpful in reducing her hypertension so she was prescribed 10mg  amlodipine . Unfortunately, this did not help her blood pressure much but did give her some significant, bilateral lower extremity edema with associated early stage of stasis dermatitis that has gotten better since its onset a week ago. Given the side effects she has had with amlodipine  and the fact her BP has not been well controlled we elected to go back down to 5mg  amlodipine  and to initiate Lisinopril  20mg . -Lisinopril  20mg  sent in -Instructed to take only 5mg  Amlodipine  -Follow up in one month with labs

## 2024-02-22 DIAGNOSIS — Z961 Presence of intraocular lens: Secondary | ICD-10-CM | POA: Diagnosis not present

## 2024-02-22 DIAGNOSIS — H353211 Exudative age-related macular degeneration, right eye, with active choroidal neovascularization: Secondary | ICD-10-CM | POA: Diagnosis not present

## 2024-02-22 DIAGNOSIS — H0012 Chalazion right lower eyelid: Secondary | ICD-10-CM | POA: Diagnosis not present

## 2024-02-22 DIAGNOSIS — H353122 Nonexudative age-related macular degeneration, left eye, intermediate dry stage: Secondary | ICD-10-CM | POA: Diagnosis not present

## 2024-02-27 ENCOUNTER — Other Ambulatory Visit: Payer: Self-pay | Admitting: Family Medicine

## 2024-02-27 DIAGNOSIS — I10 Essential (primary) hypertension: Secondary | ICD-10-CM

## 2024-02-28 ENCOUNTER — Ambulatory Visit (INDEPENDENT_AMBULATORY_CARE_PROVIDER_SITE_OTHER): Admitting: Dermatology

## 2024-02-28 DIAGNOSIS — W908XXA Exposure to other nonionizing radiation, initial encounter: Secondary | ICD-10-CM

## 2024-02-28 DIAGNOSIS — L570 Actinic keratosis: Secondary | ICD-10-CM | POA: Diagnosis not present

## 2024-02-28 DIAGNOSIS — L578 Other skin changes due to chronic exposure to nonionizing radiation: Secondary | ICD-10-CM | POA: Diagnosis not present

## 2024-02-28 DIAGNOSIS — L814 Other melanin hyperpigmentation: Secondary | ICD-10-CM

## 2024-02-28 DIAGNOSIS — L821 Other seborrheic keratosis: Secondary | ICD-10-CM

## 2024-02-28 NOTE — Progress Notes (Signed)
 Follow-Up Visit   Subjective  Kathleen Glenn is a 78 y.o. female who presents for the following: treating biopsy proven AK with warty changes at R lower lateral calf with cryotherapy.   The patient has spots, moles and lesions to be evaluated, some may be new or changing and the patient may have concern these could be cancer.   The following portions of the chart were reviewed this encounter and updated as appropriate: medications, allergies, medical history  Review of Systems:  No other skin or systemic complaints except as noted in HPI or Assessment and Plan.  Objective  Well appearing patient in no apparent distress; mood and affect are within normal limits.  A focused examination was performed of the following areas: R lower lateral calf  Relevant exam findings are noted in the Assessment and Plan.  R lower lateral calf x1 Pink scaly keratotic plaque adjacent to healing bx site   Assessment & Plan   ACTINIC DAMAGE - chronic, secondary to cumulative UV radiation exposure/sun exposure over time - diffuse scaly erythematous macules with underlying dyspigmentation - Recommend daily broad spectrum sunscreen SPF 30+ to sun-exposed areas, reapply every 2 hours as needed.  - Recommend staying in the shade or wearing long sleeves, sun glasses (UVA+UVB protection) and wide brim hats (4-inch brim around the entire circumference of the hat). - Call for new or changing lesions.  LENTIGINES Exam: scattered tan macules Due to sun exposure Treatment Plan: Benign-appearing, observe. Recommend daily broad spectrum sunscreen SPF 30+ to sun-exposed areas, reapply every 2 hours as needed.  Call for any changes  SEBORRHEIC KERATOSIS - Stuck-on, waxy, tan-brown papules and/or plaques  - Benign-appearing - Discussed benign etiology and prognosis. - Observe - Call for any changes  AK (ACTINIC KERATOSIS) R lower lateral calf x1 Biopsy proven HYPERPLASTIC ACTINIC KERATOSIS WITH WARTY  CHANGES at R lower lateral calf  Discussed lower legs take longer to heal, will recheck in 1 month and may require more than one treatment with cryotherapy.  Swelling in legs slow down healing process, recommend wearing daily compression socks.  Actinic keratoses are precancerous spots that appear secondary to cumulative UV radiation exposure/sun exposure over time. They are chronic with expected duration over 1 year. A portion of actinic keratoses will progress to squamous cell carcinoma of the skin. It is not possible to reliably predict which spots will progress to skin cancer and so treatment is recommended to prevent development of skin cancer.  Recommend daily broad spectrum sunscreen SPF 30+ to sun-exposed areas, reapply every 2 hours as needed.  Recommend staying in the shade or wearing long sleeves, sun glasses (UVA+UVB protection) and wide brim hats (4-inch brim around the entire circumference of the hat). Call for new or changing lesions. Destruction of lesion - R lower lateral calf x1  Destruction method: cryotherapy   Informed consent: discussed and consent obtained   Lesion destroyed using liquid nitrogen: Yes   Region frozen until ice ball extended beyond lesion: Yes   Outcome: patient tolerated procedure well with no complications   Post-procedure details: wound care instructions given   Additional details:  Prior to procedure, discussed risks of blister formation, small wound, skin dyspigmentation, or rare scar following cryotherapy. Recommend Vaseline ointment to treated areas while healing.   Return in 1 month (on 03/29/2024) for w/ Dr. Jackquline, AK follow-up R lower lateral calf.  I, Jacquelynn V. Wilfred, CMA, am acting as scribe for Rexene Jackquline, MD .   Documentation: I have reviewed  the above documentation for accuracy and completeness, and I agree with the above.  Rexene Rattler, MD

## 2024-02-28 NOTE — Patient Instructions (Addendum)
 Cryotherapy Aftercare  Wash gently with soap and water everyday.   Apply Vaseline and Band-Aid daily until healed.  May get it wet in the shower.  Swelling in legs slow down healing process, recommend wearing compression socks.  Due to recent changes in healthcare laws, you may see results of your pathology and/or laboratory studies on MyChart before the doctors have had a chance to review them. We understand that in some cases there may be results that are confusing or concerning to you. Please understand that not all results are received at the same time and often the doctors may need to interpret multiple results in order to provide you with the best plan of care or course of treatment. Therefore, we ask that you please give us  2 business days to thoroughly review all your results before contacting the office for clarification. Should we see a critical lab result, you will be contacted sooner.   If You Need Anything After Your Visit  If you have any questions or concerns for your doctor, please call our main line at [phone] and press option 4 to reach your doctor's medical assistant. If no one answers, please leave a voicemail as directed and we will return your call as soon as possible. Messages left after 4 pm will be answered the following business day.   You may also send us  a message via MyChart. We typically respond to MyChart messages within 1-2 business days.  For prescription refills, please ask your pharmacy to contact our office. Our fax number is [phone].  If you have an urgent issue when the clinic is closed that cannot wait until the next business day, you can page your doctor at the number below.    Please note that while we do our best to be available for urgent issues outside of office hours, we are not available 24/7.   If you have an urgent issue and are unable to reach us , you may choose to seek medical care at your doctor's office, retail clinic, urgent care  center, or emergency room.  If you have a medical emergency, please immediately call 911 or go to the emergency department.  Pager Numbers  - Dr. Hester: [phone]  - Dr. Jackquline: [phone]  - Dr. Claudene: [phone]   In the event of inclement weather, please call our main line at [phone] for an update on the status of any delays or closures.  Dermatology Medication Tips: Please keep the boxes that topical medications come in in order to help keep track of the instructions about where and how to use these. Pharmacies typically print the medication instructions only on the boxes and not directly on the medication tubes.   If your medication is too expensive, please contact our office at [phone] option 4 or send us  a message through MyChart.   We are unable to tell what your co-pay for medications will be in advance as this is different depending on your insurance coverage. However, we may be able to find a substitute medication at lower cost or fill out paperwork to get insurance to cover a needed medication.   If a prior authorization is required to get your medication covered by your insurance company, please allow us  1-2 business days to complete this process.  Drug prices often vary depending on where the prescription is filled and some pharmacies may offer cheaper prices.  The website www.goodrx.com contains coupons for medications through different pharmacies. The prices here do not account for what the  cost may be with help from insurance (it may be cheaper with your insurance), but the website can give you the price if you did not use any insurance.  - You can print the associated coupon and take it with your prescription to the pharmacy.  - You may also stop by our office during regular business hours and pick up a GoodRx coupon card.  - If you need your prescription sent electronically to a different pharmacy, notify our office through [facility] or by  phone at [phone] option 4.     Si Usted Necesita Algo Despus de Su Visita  Tambin puede enviarnos un mensaje a travs de Clinical cytogeneticist. Por lo general respondemos a los mensajes de MyChart en el transcurso de 1 a 2 das hbiles.  Para renovar recetas, por favor pida a su farmacia que se ponga en contacto con nuestra oficina. Randi lakes de fax es [place] [phone].  Si tiene un asunto urgente cuando la clnica est cerrada y que no puede esperar hasta el siguiente da hbil, puede llamar/localizar a su doctor(a) al nmero que aparece a continuacin.   Por favor, tenga en cuenta que aunque hacemos todo lo posible para estar disponibles para asuntos urgentes fuera del horario de [place], no estamos disponibles las 24 horas del da, los 7 [address]  [address] de la [place].   Si tiene un problema urgente y no puede comunicarse con nosotros, puede optar por buscar atencin mdica  en el consultorio de su doctor(a), en una clnica privada, en un centro de atencin urgente o en una sala de emergencias.  Si tiene Engineer, drilling, por favor llame inmediatamente al 911 o vaya a la sala de emergencias.  Nmeros de bper  - Dr. Hester: [phone]  - Dra. Jackquline: [phone]  - Dr. Claudene: [phone]   En caso de inclemencias del tiempo, por favor llame a landry capes principal al [phone] para una actualizacin sobre el [place] de cualquier retraso o cierre.  Consejos para la medicacin en dermatologa: Por favor, guarde las cajas en las que vienen los medicamentos de uso tpico para ayudarle a seguir las instrucciones sobre dnde y cmo usarlos. Las farmacias generalmente imprimen las instrucciones del medicamento slo en las cajas y no directamente en los tubos del [place].   Si su medicamento es muy caro, por favor, pngase en contacto con landry rieger llamando al [phone] y presione la opcin 4 o envenos un mensaje a travs de Clinical cytogeneticist.   No podemos decirle cul ser su copago  por los medicamentos por adelantado ya que esto es diferente dependiendo de la cobertura de su seguro. Sin embargo, es posible que podamos encontrar un medicamento sustituto a Audiological scientist un formulario para que el seguro cubra el medicamento que se considera necesario.   Si se requiere una autorizacin previa para que su compaa de seguros malta su medicamento, por favor permtanos de 1 a 2 das hbiles para completar este proceso.  Los precios de los medicamentos varan con frecuencia dependiendo del Environmental consultant de dnde se surte la receta y alguna farmacias pueden ofrecer precios ms baratos.  El sitio web www.goodrx.com tiene cupones para medicamentos de Health and safety inspector. Los precios aqu no tienen en cuenta lo que podra costar con la ayuda del seguro (puede ser ms barato con su seguro), pero el sitio web puede darle el precio si no utiliz Tourist information centre manager.  - Puede imprimir el cupn correspondiente y llevarlo con su receta a la farmacia.  - Tambin puede pasar  por nuestra oficina durante el horario de atencin regular y Education officer, museum una tarjeta de cupones de GoodRx.  - Si necesita que su receta se enve electrnicamente a una farmacia diferente, informe a nuestra oficina a travs de MyChart de [place] o por telfono llamando al [phone] y presione la opcin 4.

## 2024-03-06 DIAGNOSIS — S92302A Fracture of unspecified metatarsal bone(s), left foot, initial encounter for closed fracture: Secondary | ICD-10-CM | POA: Diagnosis not present

## 2024-03-11 ENCOUNTER — Ambulatory Visit: Payer: Self-pay

## 2024-03-11 NOTE — Telephone Encounter (Signed)
 Pt ended call prior to transfer.         Copied from CRM [id number]. Topic: Clinical - Red Word Triage >> Mar 11, 2024  8:30 AM Powell HERO wrote: Red Word that prompted transfer to Nurse Triage: Patient was in seen in emergent ortho last week, was injured and was also bitten by ants, was given cephalexin 500mg , patient states the medication caused her to have bloody mucous in her stool. She states she was advised not to take any longer but doesn't know how to proceed and is very worried.

## 2024-03-11 NOTE — Telephone Encounter (Signed)
 FYI Only or Action Required?: FYI only for provider.  Patient was last seen in primary care on 02/20/2024 by Myrla Jon HERO, MD. Called Nurse Triage reporting Medication Reaction and Rectal Bleeding. Symptoms began yesterday. Interventions attempted: Nothing. Symptoms are: gradually improving.  Triage Disposition: See PCP When Office is Open (Within 3 Days)  Patient/caregiver understands and will follow disposition?: Yes  Reason for Disposition  MILD rectal bleeding (more than just a few drops or streaks)  Answer Assessment - Initial Assessment Questions 1. NAME of MEDICINE: What medicine(s) are you calling about?      cephalexin 500mg  2. QUESTION: What is your question? (e.g., double dose of medicine, side effect)     Patient states she started passing blood in stool yesterday- she feels it maybe SE of medication 3. PRESCRIBER: Who prescribed the medicine? Reason: if prescribed by specialist, call should be referred to that group.     Emerge prescribed medication and told patient to stop 4. SYMPTOMS: Do you have any symptoms? If Yes, ask: What symptoms are you having?  How bad are the symptoms (e.g., mild, moderate, severe)     Bloody patient has seen small amount of blood in stool once  Answer Assessment - Initial Assessment Questions 1. APPEARANCE of BLOOD: What color is it? Is it passed separately, on the surface of the stool, or mixed in with the stool?      Mixed with stool- bloody mucus 2. AMOUNT: How much blood was passed?      Only seem in water and stool 3. FREQUENCY: How many times has blood been passed with the stools?      Started yesterday- 2 yesterday, one today- has decreased 4. ONSET: When was the blood first seen in the stools? (Days or weeks)      Started yesterday  5. DIARRHEA: Is there also some diarrhea? If Yes, ask: How many diarrhea stools in the past 24 hours?      no 6. CONSTIPATION: Do you have constipation? If Yes, ask: How  bad is it?     no 7. RECURRENT SYMPTOMS: Have you had blood in your stools before? If Yes, ask: When was the last time? and What happened that time?      no 8. BLOOD THINNERS: Do you take any blood thinners? (e.g., Coumadin/warfarin, Pradaxa/dabigatran, aspirin)     no 9. OTHER SYMPTOMS: Do you have any other symptoms?  (e.g., abdomen pain, vomiting, dizziness, fever)     no  Protocols used: Medication Question Call-A-AH, Rectal Bleeding-A-AH

## 2024-03-12 ENCOUNTER — Ambulatory Visit (INDEPENDENT_AMBULATORY_CARE_PROVIDER_SITE_OTHER): Admitting: Physician Assistant

## 2024-03-12 VITALS — BP 130/72 | HR 85 | Resp 16 | Ht 63.0 in

## 2024-03-12 DIAGNOSIS — K625 Hemorrhage of anus and rectum: Secondary | ICD-10-CM

## 2024-03-12 NOTE — Progress Notes (Unsigned)
 Established patient visit  Patient: Kathleen Glenn   DOB: December 14, 1945   78 y.o. Female  MRN: [id number] Visit Date: 03/12/2024  Today's healthcare provider: Jolynn Spencer, PA-C   Chief Complaint  Patient presents with  . Rectal Bleeding    Rectal Bleeding Sunday passed blood Pt was told to stop meds. X1day   Subjective     HPI     Rectal Bleeding    Additional comments: Rectal Bleeding Sunday passed blood Pt was told to stop meds. Roddie      Last edited by Marylen Odella CROME, CMA on 03/12/2024  3:54 PM.       Discussed the use of AI scribe software for clinical note transcription with the patient, who gave verbal consent to proceed.  History of Present Illness Kathleen Glenn is a 78 year old female who presents with rectal bleeding and a foot injury. She is accompanied by her daughter, Suzen.  Rectal bleeding began on Sunday, characterized by mucous and bright red blood in the stool. She has a history of fistula removal in 2016 and bowel blockage due to a hernia requiring emergency surgery almost three years ago. No prior issues with bleeding since these events. No colonoscopy or history of hemorrhoids. Occasional constipation occurs once every one to two weeks.  Foot injury occurred two weeks ago after a fall, followed by ant bites. Cephalexin was prescribed for four days due to cellulitis concerns but was stopped on Sunday after rectal bleeding. The foot was bruised, swollen, and sore on the top and side, but has improved significantly. An old injury from a lawnmower accident was identified during recent x-rays. She is keeping the foot elevated and minimizing walking.  No known allergies to antibiotics except for diclofenac. No diabetes or known autoimmune diseases. Blood pressure has been elevated, with increased stress due to her husband's hospitalization.       02/20/2024    9:27 AM 01/04/2024    1:17 PM 07/18/2023    9:28 AM  Depression screen PHQ 2/9  Decreased  Interest 0 0 0  Down, Depressed, Hopeless 0 0 0  PHQ - 2 Score 0 0 0  Altered sleeping 1 2   Tired, decreased energy 0 2   Change in appetite 0 0   Feeling bad or failure about yourself  0 0   Trouble concentrating 0 0   Moving slowly or fidgety/restless 0 0   Suicidal thoughts 0 0   PHQ-9 Score 1 4   Difficult doing work/chores Not difficult at all Not difficult at all       02/20/2024    9:28 AM 01/04/2024    1:17 PM 07/18/2023    9:28 AM  GAD 7 : Generalized Anxiety Score  Nervous, Anxious, on Edge 1 1 1   Control/stop worrying 1 1 1   Worry too much - different things 1 1 1   Trouble relaxing 1 1 1   Restless 0 0 0  Easily annoyed or irritable 0 0 0  Afraid - awful might happen 0 1 0  Total GAD 7 Score 4 5 4   Anxiety Difficulty Not difficult at all Not difficult at all Not difficult at all    Medications: Outpatient Medications Prior to Visit  Medication Sig  . Aflibercept  (EYLEA  IO) Inject 1 each into the eye every 6 (six) weeks.  . amLODipine  (NORVASC ) 5 MG tablet Take 1 tablet (5 mg total) by mouth daily.  . Biotin 1 MG CAPS Take by mouth daily.   SABRA  Calcium Carbonate-Vit D-Min (CALCIUM 600+D PLUS MINERALS) 600-400 MG-UNIT TABS Take 1 tablet by mouth daily.  . hydrochlorothiazide  (HYDRODIURIL ) 25 MG tablet Take 1 tablet (25 mg total) by mouth daily.  . lisinopril  (ZESTRIL ) 20 MG tablet Take 1 tablet (20 mg total) by mouth daily.  . magnesium oxide (MAG-OX) 400 MG tablet Take 400 mg by mouth daily.  . meloxicam  (MOBIC ) 7.5 MG tablet TAKE 1 TABLET BY MOUTH EVERY DAY AS NEEDED FOR PAIN  . Multiple Vitamins-Minerals (PRESERVISION AREDS 2) CAPS Take by mouth.  . sertraline  (ZOLOFT ) 25 MG tablet TAKE 1 TABLET (25 MG TOTAL) BY MOUTH DAILY.   No facility-administered medications prior to visit.    Review of Systems All negative Except see HPI   {Insert previous labs (optional):23779} {See past labs  Heme  Chem  Endocrine  Serology  Results Review (optional):1}    Objective    There were no vitals taken for this visit. {Insert last BP/Wt (optional):23777}{See vitals history (optional):1}   Physical Exam   No results found for any visits on 03/12/24.      Assessment and Plan Assessment & Plan Rectal Bleeding Acute rectal bleeding with mucous and bright red blood. Differential includes C. difficile infection due to recent cephalexin use. Cephalexin discontinued to prevent potential C. difficile infection. - Order blood work for infection markers. - Provide stool sample kit for C. difficile testing. - Advise emergency care if symptoms worsen.  Foot Injury and Ant Bites Recent foot injury and ant bites causing redness and swelling. Symptoms improving. No infection spread beyond foot. Avoid antibiotics due to rectal bleeding risk. - Advise ice compresses and foot elevation. - Recommend antihistamines for inflammation and itching if needed. - Follow-up with [facility] for reassessment and potential boot removal.  General Health Maintenance Due for tetanus vaccination. - Administer tetanus vaccination.    Orders Placed This Encounter  Procedures  . C difficile Toxins A+B W/Rflx  . CBC with Differential/Platelet  . Comprehensive metabolic panel with GFR  . C-reactive protein  . INR/PT  . POC FIT Test    No follow-ups on file.   The patient was advised to call back or seek an in-person evaluation if the symptoms worsen or if the condition fails to improve as anticipated.  I discussed the assessment and treatment plan with the patient. The patient was provided an opportunity to ask questions and all were answered. The patient agreed with the plan and demonstrated an understanding of the instructions.  I, Shanteria Laye, PA-C have reviewed all documentation for this visit. The documentation on 03/12/2024  for the exam, diagnosis, procedures, and orders are all accurate and complete.  Jolynn Spencer, [facility], MMS [facility] [phone] (phone) [phone] (fax)  [facility]

## 2024-03-13 ENCOUNTER — Other Ambulatory Visit: Payer: Self-pay | Admitting: Family Medicine

## 2024-03-13 ENCOUNTER — Encounter: Payer: Self-pay | Admitting: Physician Assistant

## 2024-03-13 DIAGNOSIS — M1990 Unspecified osteoarthritis, unspecified site: Secondary | ICD-10-CM

## 2024-03-13 LAB — SPECIMEN STATUS REPORT

## 2024-03-14 ENCOUNTER — Ambulatory Visit: Payer: Self-pay | Admitting: Physician Assistant

## 2024-03-14 LAB — COMPREHENSIVE METABOLIC PANEL WITH GFR
ALT: 16 IU/L (ref 0–32)
AST: 21 IU/L (ref 0–40)
Albumin: 4.3 g/dL (ref 3.8–4.8)
Alkaline Phosphatase: 103 IU/L (ref 44–121)
BUN/Creatinine Ratio: 31 — ABNORMAL HIGH (ref 12–28)
BUN: 24 mg/dL (ref 8–27)
Bilirubin Total: 0.4 mg/dL (ref 0.0–1.2)
CO2: 22 mmol/L (ref 20–29)
Calcium: 9.4 mg/dL (ref 8.7–10.3)
Chloride: 92 mmol/L — ABNORMAL LOW (ref 96–106)
Creatinine, Ser: 0.78 mg/dL (ref 0.57–1.00)
Globulin, Total: 3.3 g/dL (ref 1.5–4.5)
Glucose: 73 mg/dL (ref 70–99)
Potassium: 3.6 mmol/L (ref 3.5–5.2)
Sodium: 134 mmol/L (ref 134–144)
Total Protein: 7.6 g/dL (ref 6.0–8.5)
eGFR: 79 mL/min/1.73 (ref >59)

## 2024-03-14 LAB — PROTIME-INR
INR: 1.1 (ref 0.9–1.2)
Prothrombin Time: 11.9 s (ref 9.1–12.0)

## 2024-03-14 LAB — CBC WITH DIFFERENTIAL/PLATELET
Basophils Absolute: 0 x10E3/uL (ref 0.0–0.2)
Basos: 1 %
EOS (ABSOLUTE): 0.1 x10E3/uL (ref 0.0–0.4)
Eos: 1 %
Hematocrit: 39.4 % (ref 34.0–46.6)
Hemoglobin: 12.9 g/dL (ref 11.1–15.9)
Immature Grans (Abs): 0 x10E3/uL (ref 0.0–0.1)
Immature Granulocytes: 0 %
Lymphocytes Absolute: 1.6 x10E3/uL (ref 0.7–3.1)
Lymphs: 18 %
MCH: 31.9 pg (ref 26.6–33.0)
MCHC: 32.7 g/dL (ref 31.5–35.7)
MCV: 98 fL — ABNORMAL HIGH (ref 79–97)
Monocytes Absolute: 0.6 x10E3/uL (ref 0.1–0.9)
Monocytes: 6 %
Neutrophils Absolute: 6.4 x10E3/uL (ref 1.4–7.0)
Neutrophils: 74 %
Platelets: 378 x10E3/uL (ref 150–450)
RBC: 4.04 x10E6/uL (ref 3.77–5.28)
RDW: 13 % (ref 11.7–15.4)
WBC: 8.8 x10E3/uL (ref 3.4–10.8)

## 2024-03-14 LAB — C-REACTIVE PROTEIN: CRP: 6 mg/L (ref 0–10)

## 2024-03-15 DIAGNOSIS — K625 Hemorrhage of anus and rectum: Secondary | ICD-10-CM | POA: Diagnosis not present

## 2024-03-15 DIAGNOSIS — S92342A Displaced fracture of fourth metatarsal bone, left foot, initial encounter for closed fracture: Secondary | ICD-10-CM | POA: Diagnosis not present

## 2024-03-15 DIAGNOSIS — S92322A Displaced fracture of second metatarsal bone, left foot, initial encounter for closed fracture: Secondary | ICD-10-CM | POA: Diagnosis not present

## 2024-03-15 DIAGNOSIS — S92332A Displaced fracture of third metatarsal bone, left foot, initial encounter for closed fracture: Secondary | ICD-10-CM | POA: Diagnosis not present

## 2024-03-15 DIAGNOSIS — S92352A Displaced fracture of fifth metatarsal bone, left foot, initial encounter for closed fracture: Secondary | ICD-10-CM | POA: Diagnosis not present

## 2024-03-15 DIAGNOSIS — L03116 Cellulitis of left lower limb: Secondary | ICD-10-CM | POA: Diagnosis not present

## 2024-03-16 ENCOUNTER — Other Ambulatory Visit: Payer: Self-pay | Admitting: Family Medicine

## 2024-03-18 NOTE — Telephone Encounter (Signed)
 Requested Prescriptions  Pending Prescriptions Disp Refills   hydrochlorothiazide  (HYDRODIURIL ) 25 MG tablet [Pharmacy Med Name: HYDROCHLOROTHIAZIDE  25 MG TAB] 90 tablet 1    Sig: TAKE 1 TABLET (25 MG TOTAL) BY MOUTH DAILY.     Cardiovascular: Diuretics - Thiazide Passed - 03/18/2024  5:11 PM      Passed - Cr in normal range and within 180 days    Creatinine  Date Value Ref Range Status  10/12/2011 0.78 0.60 - 1.30 mg/dL Final   Creatinine, Ser  Date Value Ref Range Status  03/12/2024 0.78 0.57 - 1.00 mg/dL Final         Passed - K in normal range and within 180 days    Potassium  Date Value Ref Range Status  03/12/2024 3.6 3.5 - 5.2 mmol/L Final  10/12/2011 3.8 3.5 - 5.1 mmol/L Final         Passed - Na in normal range and within 180 days    Sodium  Date Value Ref Range Status  03/12/2024 134 134 - 144 mmol/L Final  10/12/2011 140 136 - 145 mmol/L Final         Passed - Last BP in normal range    BP Readings from Last 1 Encounters:  03/13/24 130/72         Passed - Valid encounter within last 6 months    Recent Outpatient Visits           6 days ago Rectal bleeding   [place] [facility] [place], [place], PA-C   3 weeks ago Bilateral lower extremity edema   [facility] [facility] [place], Jon HERO, MD   2 months ago Essential hypertension   [facility] [place] Jon HERO, MD       Future Appointments             In 2 weeks Jackquline Sawyer, MD [facility]

## 2024-03-19 LAB — C DIFFICILE, CYTOTOXIN B

## 2024-03-19 LAB — C DIFFICILE TOXINS A+B W/RFLX: C difficile Toxins A+B, EIA: NEGATIVE

## 2024-03-21 ENCOUNTER — Ambulatory Visit (INDEPENDENT_AMBULATORY_CARE_PROVIDER_SITE_OTHER): Admitting: Family Medicine

## 2024-03-21 ENCOUNTER — Encounter: Payer: Self-pay | Admitting: Family Medicine

## 2024-03-21 VITALS — BP 130/70 | HR 80 | Temp 98.0°F | Resp 16

## 2024-03-21 DIAGNOSIS — I10 Essential (primary) hypertension: Secondary | ICD-10-CM | POA: Diagnosis not present

## 2024-03-21 DIAGNOSIS — R600 Localized edema: Secondary | ICD-10-CM | POA: Diagnosis not present

## 2024-03-21 MED ORDER — AMLODIPINE BESYLATE 5 MG PO TABS: 5.0000 mg | ORAL_TABLET | Freq: Every day | ORAL | 1 refills | Status: DC

## 2024-03-21 NOTE — Assessment & Plan Note (Addendum)
 Hypertension is managed with amlodipine  5 mg and lisinopril  20 mg. Home blood pressure readings are satisfactory, with occasional increases due to stress. Peripheral edema improved after reducing amlodipine  dose. Lisinopril  causes a mild dry cough, but it is tolerable and the medication is effective. (Also unclear if this was recent URI) - Continue amlodipine  5 mg daily - Continue lisinopril  20 mg daily - Monitor blood pressure at home - Monitor for worsening of dry cough and report if it becomes intolerable - Refill amlodipine  prescription

## 2024-03-21 NOTE — Progress Notes (Signed)
 Established patient visit   Patient: Kathleen Glenn   DOB: 11-13-1945   77 y.o. Female  MRN: [id number] Visit Date: 03/21/2024  Today's healthcare provider: Jon Eva, MD   Chief Complaint  Patient presents with   Follow-up    Edema and HTN   Subjective    HPI HPI     Follow-up    Additional comments: Edema and HTN      Last edited by Rosas, Joseline E, CMA on 03/21/2024  9:59 AM.       Discussed the use of AI scribe software for clinical note transcription with the patient, who gave verbal consent to proceed.  History of Present Illness   Kathleen Glenn is a 78 year old female with hypertension who presents for follow-up on her blood pressure management.  Home blood pressure readings are generally in the 120s/70s, with a recent increase to 132/unknown, possibly due to stress from a family death. She takes amlodipine  5 mg and lisinopril  20 mg. Leg swelling has slightly improved after reducing amlodipine , but some swelling persists. She has a dry cough since starting lisinopril , which is tolerable.  She experienced a bleeding episode after taking an antibiotic for a foot injury, leading her to stop the medication. The bleeding has not recurred. Ant bites on her foot are healing, with small black dots remaining. She wears a boot for a foot injury, which is awkward and tiring, and is cautious about bending her foot due to a broken bone.         Medications: Outpatient Medications Prior to Visit  Medication Sig   Aflibercept  (EYLEA  IO) Inject 1 each into the eye every 6 (six) weeks.   Biotin 1 MG CAPS Take by mouth daily.    Calcium Carbonate-Vit D-Min (CALCIUM 600+D PLUS MINERALS) 600-400 MG-UNIT TABS Take 1 tablet by mouth daily.   hydrochlorothiazide  (HYDRODIURIL ) 25 MG tablet TAKE 1 TABLET (25 MG TOTAL) BY MOUTH DAILY.   lisinopril  (ZESTRIL ) 20 MG tablet TAKE 1 TABLET BY MOUTH EVERY DAY   magnesium oxide (MAG-OX) 400 MG tablet Take 400 mg by mouth  daily.   meloxicam  (MOBIC ) 7.5 MG tablet TAKE 1 TABLET BY MOUTH EVERY DAY AS NEEDED FOR PAIN   Multiple Vitamins-Minerals (PRESERVISION AREDS 2) CAPS Take by mouth.   sertraline  (ZOLOFT ) 25 MG tablet TAKE 1 TABLET (25 MG TOTAL) BY MOUTH DAILY.   [DISCONTINUED] amLODipine  (NORVASC ) 5 MG tablet Take 1 tablet (5 mg total) by mouth daily.   No facility-administered medications prior to visit.    Review of Systems     Objective    BP 130/70 (BP Location: Left Arm, Patient Position: Sitting, Cuff Size: Normal)   Pulse 80   Temp 98 F (36.7 C) (Oral)   Resp 16   SpO2 100%    Physical Exam Vitals reviewed.  Constitutional:      General: She is not in acute distress.    Appearance: Normal appearance. She is well-developed. She is not diaphoretic.  HENT:     Head: Normocephalic and atraumatic.  Eyes:     General: No scleral icterus.    Conjunctiva/sclera: Conjunctivae normal.  Neck:     Thyroid : No thyromegaly.  Cardiovascular:     Rate and Rhythm: Normal rate and regular rhythm.     Pulses: Normal pulses.     Heart sounds: Normal heart sounds. No murmur heard. Pulmonary:     Effort: Pulmonary effort is normal. No respiratory distress.  Breath sounds: Normal breath sounds. No wheezing, rhonchi or rales.  Musculoskeletal:     Cervical back: Neck supple.     Right lower leg: Edema (trace) present.     Left lower leg: Edema (trace) present.  Lymphadenopathy:     Cervical: No cervical adenopathy.  Skin:    General: Skin is warm and dry.  Neurological:     Mental Status: She is alert and oriented to person, place, and time. Mental status is at baseline.  Psychiatric:        Mood and Affect: Mood normal.        Behavior: Behavior normal.      No results found for any visits on 03/21/24.  Assessment & Plan     Problem List Items Addressed This Visit       Cardiovascular and Mediastinum   Essential hypertension - Primary   Hypertension is managed with amlodipine  5  mg and lisinopril  20 mg. Home blood pressure readings are satisfactory, with occasional increases due to stress. Peripheral edema improved after reducing amlodipine  dose. Lisinopril  causes a mild dry cough, but it is tolerable and the medication is effective. (Also unclear if this was recent URI) - Continue amlodipine  5 mg daily - Continue lisinopril  20 mg daily - Monitor blood pressure at home - Monitor for worsening of dry cough and report if it becomes intolerable - Refill amlodipine  prescription      Relevant Medications   amLODipine  (NORVASC ) 5 MG tablet     Other   Bilateral lower extremity edema        Foot injury with ant bites, resolved Foot injury and ant bites have resolved. Ant bites were from non-fire ants, which have antibacterial properties in their saliva, reducing the risk of infection. Antibiotic was discontinued after rectal bleeding incident, and no infection developed.  Rectal bleeding, resolved Rectal bleeding occurred after starting an antibiotic for foot injury. Bleeding resolved after discontinuing the antibiotic. Stool test and blood work were normal.        Return in about 6 months (around 09/21/2024) for chronic disease f/u.       Jon Eva, MD  [facility] [phone] (phone) [phone] (fax)  [facility]

## 2024-04-01 ENCOUNTER — Encounter: Payer: Self-pay | Admitting: Dermatology

## 2024-04-01 ENCOUNTER — Ambulatory Visit: Admitting: Dermatology

## 2024-04-01 DIAGNOSIS — L570 Actinic keratosis: Secondary | ICD-10-CM | POA: Diagnosis not present

## 2024-04-01 DIAGNOSIS — L821 Other seborrheic keratosis: Secondary | ICD-10-CM

## 2024-04-01 NOTE — Progress Notes (Signed)
   Follow-Up Visit   Subjective  Kathleen Glenn is a 78 y.o. female who presents for the following: Recheck AK R lower lat calf, 22m f/u, improved with LN2, check spot R temple area, not sure how long it has been there   The following portions of the chart were reviewed this encounter and updated as appropriate: medications, allergies, medical history  Review of Systems:  No other skin or systemic complaints except as noted in HPI or Assessment and Plan.  Objective  Well appearing patient in no apparent distress; mood and affect are within normal limits.   A focused examination was performed of the following areas: Right lower leg  Relevant exam findings are noted in the Assessment and Plan.  R lower lat calf     Assessment & Plan   HYPERPLASTIC ACTINIC KERATOSIS WITH WARTY CHANGES Bx proven 1 month S/P LN2 R lower lat calf Exam: pink patch with overlying scale, no induration  Treatment Plan: Mechanical debridement today of overlying scale Healing well, no recurrence at this time, will continue to observe Start Cerave Psoriasis cream to apply bid to R lower lat calf  Recommend daily broad spectrum sunscreen SPF 30+ to sun-exposed areas, reapply every 2 hours as needed. Call for new or changing lesions.  Staying in the shade or wearing long sleeves, sun glasses (UVA+UVB protection) and wide brim hats (4-inch brim around the entire circumference of the hat) are also recommended for sun protection.    SEBORRHEIC KERATOSIS R temple - Stuck-on, waxy, tan-brown papule - Benign-appearing - Discussed benign etiology and prognosis. - Observe - Call for any changes       Return in about 2 months (around 06/02/2024) for recheck AK R llower lat calf.  I, Grayce Saunas, RMA, am acting as scribe for Rexene Rattler, MD .   Documentation: I have reviewed the above documentation for accuracy and completeness, and I agree with the above.  Rexene Rattler, MD

## 2024-04-01 NOTE — Patient Instructions (Addendum)
 Start Cerave Psoriasis cream twice a day to right lower lateral calf   Due to recent changes in healthcare laws, you may see results of your pathology and/or laboratory studies on MyChart before the doctors have had a chance to review them. We understand that in some cases there may be results that are confusing or concerning to you. Please understand that not all results are received at the same time and often the doctors may need to interpret multiple results in order to provide you with the best plan of care or course of treatment. Therefore, we ask that you please give us  2 business days to thoroughly review all your results before contacting the office for clarification. Should we see a critical lab result, you will be contacted sooner.   If You Need Anything After Your Visit  If you have any questions or concerns for your doctor, please call our main line at [phone] and press option 4 to reach your doctor's medical assistant. If no one answers, please leave a voicemail as directed and we will return your call as soon as possible. Messages left after 4 pm will be answered the following business day.   You may also send us  a message via MyChart. We typically respond to MyChart messages within 1-2 business days.  For prescription refills, please ask your pharmacy to contact our office. Our fax number is [phone].  If you have an urgent issue when the clinic is closed that cannot wait until the next business day, you can page your doctor at the number below.    Please note that while we do our best to be available for urgent issues outside of office hours, we are not available 24/7.   If you have an urgent issue and are unable to reach us , you may choose to seek medical care at your doctor's office, retail clinic, urgent care center, or emergency room.  If you have a medical emergency, please immediately call 911 or go to the emergency department.  Pager Numbers  - Dr. Hester:  [phone]  - Dr. Jackquline: [phone]  - Dr. Claudene: [phone]   In the event of inclement weather, please call our main line at [phone] for an update on the status of any delays or closures.  Dermatology Medication Tips: Please keep the boxes that topical medications come in in order to help keep track of the instructions about where and how to use these. Pharmacies typically print the medication instructions only on the boxes and not directly on the medication tubes.   If your medication is too expensive, please contact our office at [phone] option 4 or send us  a message through MyChart.   We are unable to tell what your co-pay for medications will be in advance as this is different depending on your insurance coverage. However, we may be able to find a substitute medication at lower cost or fill out paperwork to get insurance to cover a needed medication.   If a prior authorization is required to get your medication covered by your insurance company, please allow us  1-2 business days to complete this process.  Drug prices often vary depending on where the prescription is filled and some pharmacies may offer cheaper prices.  The website www.goodrx.com contains coupons for medications through different pharmacies. The prices here do not account for what the cost may be with help from insurance (it may be cheaper with your insurance), but the website can give you the price if you did not  use any insurance.  - You can print the associated coupon and take it with your prescription to the pharmacy.  - You may also stop by our office during regular business hours and pick up a GoodRx coupon card.  - If you need your prescription sent electronically to a different pharmacy, notify our office through [facility] or by phone at [phone] option 4.     Si Usted Necesita Algo Despus de Su Visita  Tambin puede enviarnos un mensaje a travs de Clinical cytogeneticist. Por lo general  respondemos a los mensajes de MyChart en el transcurso de 1 a 2 das hbiles.  Para renovar recetas, por favor pida a su farmacia que se ponga en contacto con nuestra oficina. Randi lakes de fax es [place] [phone].  Si tiene un asunto urgente cuando la clnica est cerrada y que no puede esperar hasta el siguiente da hbil, puede llamar/localizar a su doctor(a) al nmero que aparece a continuacin.   Por favor, tenga en cuenta que aunque hacemos todo lo posible para estar disponibles para asuntos urgentes fuera del horario de [place], no estamos disponibles las 24 horas del da, los 7 [address]  [address] de la [place].   Si tiene un problema urgente y no puede comunicarse con nosotros, puede optar por buscar atencin mdica  en el consultorio de su doctor(a), en una clnica privada, en un centro de atencin urgente o en una sala de emergencias.  Si tiene Engineer, drilling, por favor llame inmediatamente al 911 o vaya a la sala de emergencias.  Nmeros de bper  - Dr. Hester: [phone]  - Dra. Jackquline: [phone]  - Dr. Claudene: [phone]   En caso de inclemencias del tiempo, por favor llame a landry capes principal al [phone] para una actualizacin sobre el [place] de cualquier retraso o cierre.  Consejos para la medicacin en dermatologa: Por favor, guarde las cajas en las que vienen los medicamentos de uso tpico para ayudarle a seguir las instrucciones sobre dnde y cmo usarlos. Las farmacias generalmente imprimen las instrucciones del medicamento slo en las cajas y no directamente en los tubos del [place].   Si su medicamento es muy caro, por favor, pngase en contacto con landry rieger llamando al [phone] y presione la opcin 4 o envenos un mensaje a travs de Clinical cytogeneticist.   No podemos decirle cul ser su copago por los medicamentos por adelantado ya que esto es diferente dependiendo de la cobertura de su seguro. Sin embargo, es posible que podamos encontrar un  medicamento sustituto a Audiological scientist un formulario para que el seguro cubra el medicamento que se considera necesario.   Si se requiere una autorizacin previa para que su compaa de seguros malta su medicamento, por favor permtanos de 1 a 2 das hbiles para completar este proceso.  Los precios de los medicamentos varan con frecuencia dependiendo del Environmental consultant de dnde se surte la receta y alguna farmacias pueden ofrecer precios ms baratos.  El sitio web www.goodrx.com tiene cupones para medicamentos de Health and safety inspector. Los precios aqu no tienen en cuenta lo que podra costar con la ayuda del seguro (puede ser ms barato con su seguro), pero el sitio web puede darle el precio si no utiliz Tourist information centre manager.  - Puede imprimir el cupn correspondiente y llevarlo con su receta a la farmacia.  - Tambin puede pasar por nuestra oficina durante el horario de atencin regular y Education officer, museum una tarjeta de cupones de GoodRx.  - Si necesita que su receta se enve  electrnicamente a ignacia bender diferente, informe a nuestra oficina a travs de MyChart de [place] o por telfono llamando al [phone] y presione la opcin 4.

## 2024-04-11 ENCOUNTER — Ambulatory Visit: Admitting: Family Medicine

## 2024-04-12 DIAGNOSIS — S92352D Displaced fracture of fifth metatarsal bone, left foot, subsequent encounter for fracture with routine healing: Secondary | ICD-10-CM | POA: Diagnosis not present

## 2024-04-12 DIAGNOSIS — S92342D Displaced fracture of fourth metatarsal bone, left foot, subsequent encounter for fracture with routine healing: Secondary | ICD-10-CM | POA: Diagnosis not present

## 2024-04-12 DIAGNOSIS — S92332D Displaced fracture of third metatarsal bone, left foot, subsequent encounter for fracture with routine healing: Secondary | ICD-10-CM | POA: Diagnosis not present

## 2024-04-12 DIAGNOSIS — S92322D Displaced fracture of second metatarsal bone, left foot, subsequent encounter for fracture with routine healing: Secondary | ICD-10-CM | POA: Diagnosis not present

## 2024-04-18 DIAGNOSIS — H43813 Vitreous degeneration, bilateral: Secondary | ICD-10-CM | POA: Diagnosis not present

## 2024-04-18 DIAGNOSIS — H353122 Nonexudative age-related macular degeneration, left eye, intermediate dry stage: Secondary | ICD-10-CM | POA: Diagnosis not present

## 2024-04-18 DIAGNOSIS — H353211 Exudative age-related macular degeneration, right eye, with active choroidal neovascularization: Secondary | ICD-10-CM | POA: Diagnosis not present

## 2024-04-18 DIAGNOSIS — T8522XS Displacement of intraocular lens, sequela: Secondary | ICD-10-CM | POA: Diagnosis not present

## 2024-05-08 ENCOUNTER — Encounter

## 2024-05-10 DIAGNOSIS — S92322D Displaced fracture of second metatarsal bone, left foot, subsequent encounter for fracture with routine healing: Secondary | ICD-10-CM | POA: Diagnosis not present

## 2024-05-10 DIAGNOSIS — M2012 Hallux valgus (acquired), left foot: Secondary | ICD-10-CM | POA: Diagnosis not present

## 2024-05-10 DIAGNOSIS — S92332D Displaced fracture of third metatarsal bone, left foot, subsequent encounter for fracture with routine healing: Secondary | ICD-10-CM | POA: Diagnosis not present

## 2024-05-10 DIAGNOSIS — S92342D Displaced fracture of fourth metatarsal bone, left foot, subsequent encounter for fracture with routine healing: Secondary | ICD-10-CM | POA: Diagnosis not present

## 2024-05-10 DIAGNOSIS — S92352D Displaced fracture of fifth metatarsal bone, left foot, subsequent encounter for fracture with routine healing: Secondary | ICD-10-CM | POA: Diagnosis not present

## 2024-05-23 ENCOUNTER — Other Ambulatory Visit: Payer: Self-pay | Admitting: Family Medicine

## 2024-05-23 DIAGNOSIS — M1990 Unspecified osteoarthritis, unspecified site: Secondary | ICD-10-CM

## 2024-06-04 ENCOUNTER — Ambulatory Visit: Admitting: Dermatology

## 2024-06-12 DIAGNOSIS — H353211 Exudative age-related macular degeneration, right eye, with active choroidal neovascularization: Secondary | ICD-10-CM | POA: Diagnosis not present

## 2024-07-22 ENCOUNTER — Telehealth: Payer: Self-pay

## 2024-07-22 NOTE — Progress Notes (Signed)
 Pharmacy Quality Measure Review  This patient is appearing on a report for being at risk of failing the adherence measure for hypertension (ACEi/ARB) medications this calendar year.   Medication: lisinopril  Last fill date: 06/03/24 for 90 day supply  Insurance report was not up to date. No action needed at this time.   Mallery Harshman E. Marsh, PharmD, CPP Clinical Pharmacist [facility] [phone]

## 2024-08-09 DIAGNOSIS — H353211 Exudative age-related macular degeneration, right eye, with active choroidal neovascularization: Secondary | ICD-10-CM | POA: Diagnosis not present

## 2024-08-13 ENCOUNTER — Encounter: Payer: Self-pay | Admitting: Surgery

## 2024-08-21 ENCOUNTER — Other Ambulatory Visit: Payer: Self-pay | Admitting: Family Medicine

## 2024-08-21 DIAGNOSIS — M1990 Unspecified osteoarthritis, unspecified site: Secondary | ICD-10-CM

## 2024-09-12 ENCOUNTER — Other Ambulatory Visit: Payer: Self-pay | Admitting: Family Medicine

## 2024-09-26 ENCOUNTER — Ambulatory Visit: Admitting: Family Medicine

## 2024-09-28 ENCOUNTER — Other Ambulatory Visit: Payer: Self-pay | Admitting: Family Medicine

## 2024-09-28 DIAGNOSIS — I10 Essential (primary) hypertension: Secondary | ICD-10-CM

## 2024-09-30 NOTE — Telephone Encounter (Signed)
 Requested by interface surescripts. Courtesy refill. Future visit 10/28/24. Requested Prescriptions  Pending Prescriptions Disp Refills   amLODipine  (NORVASC ) 5 MG tablet [Pharmacy Med Name: AMLODIPINE  BESYLATE 5 MG TAB] 90 tablet 0    Sig: TAKE 1 TABLET (5 MG TOTAL) BY MOUTH DAILY.     Cardiovascular: Calcium Channel Blockers 2 Failed - 09/30/2024 11:41 AM      Failed - Valid encounter within last 6 months    Recent Outpatient Visits           6 months ago Essential hypertension   [facility] [place], Jon HERO, MD   6 months ago Rectal bleeding   [place] [facility] [place], [place], PA-C   7 months ago Bilateral lower extremity edema   [place] [facility], Jon HERO, MD   9 months ago Essential hypertension   [place] [facility] [place], Jon HERO, MD              Passed - Last BP in normal range    BP Readings from Last 1 Encounters:  03/21/24 130/70         Passed - Last Heart Rate in normal range    Pulse Readings from Last 1 Encounters:  03/21/24 80

## 2024-10-28 ENCOUNTER — Ambulatory Visit: Admitting: Family Medicine

## 2025-02-18 ENCOUNTER — Encounter: Admitting: Dermatology
